# Patient Record
Sex: Female | Born: 1940 | Race: White | Hispanic: No | Marital: Married | Smoking: Never smoker
Health system: Southern US, Community
[De-identification: ages and names within clinical notes are randomized; demographics above are authoritative.]

## PROBLEM LIST (undated history)

## (undated) DIAGNOSIS — Z853 Personal history of malignant neoplasm of breast: Secondary | ICD-10-CM

## (undated) DIAGNOSIS — G20A1 Parkinson's disease without dyskinesia, without mention of fluctuations: Secondary | ICD-10-CM

## (undated) DIAGNOSIS — G20 Parkinson's disease: Secondary | ICD-10-CM

## (undated) HISTORY — PX: ABDOMINAL HYSTERECTOMY: SHX81

## (undated) HISTORY — PX: OTHER SURGICAL HISTORY: SHX169

## (undated) HISTORY — PX: BLADDER REPAIR: SHX76

## (undated) HISTORY — PX: EYE SURGERY: SHX253

## (undated) HISTORY — PX: BREAST SURGERY: SHX581

## (undated) HISTORY — PX: PITUITARY SURGERY: SHX203

---

## 2004-05-14 ENCOUNTER — Ambulatory Visit: Payer: Self-pay | Admitting: Family Medicine

## 2004-08-19 ENCOUNTER — Ambulatory Visit: Payer: Self-pay | Admitting: Family Medicine

## 2005-02-13 ENCOUNTER — Ambulatory Visit: Payer: Self-pay | Admitting: Family Medicine

## 2005-04-22 ENCOUNTER — Ambulatory Visit: Payer: Self-pay | Admitting: Family Medicine

## 2005-04-25 ENCOUNTER — Ambulatory Visit (HOSPITAL_COMMUNITY): Admission: RE | Admit: 2005-04-25 | Discharge: 2005-04-25 | Payer: Self-pay | Admitting: Urology

## 2007-05-17 ENCOUNTER — Ambulatory Visit (HOSPITAL_COMMUNITY): Admission: RE | Admit: 2007-05-17 | Discharge: 2007-05-17 | Payer: Self-pay | Admitting: Urology

## 2011-04-08 LAB — CREATININE, SERUM
Creatinine, Ser: 0.8
GFR calc Af Amer: >60
GFR calc non Af Amer: >60

## 2015-05-19 ENCOUNTER — Encounter (HOSPITAL_COMMUNITY)
Admission: EM | Disposition: A | Discharge status: Skilled Nursing Facility | Payer: Self-pay | Source: Home / Self Care | Attending: Orthopaedic Surgery

## 2015-05-19 ENCOUNTER — Inpatient Hospital Stay (HOSPITAL_COMMUNITY): Payer: Medicare PPO | Admitting: Anesthesiology

## 2015-05-19 ENCOUNTER — Inpatient Hospital Stay (HOSPITAL_COMMUNITY): Payer: Medicare PPO

## 2015-05-19 ENCOUNTER — Emergency Department (HOSPITAL_COMMUNITY): Payer: Medicare PPO

## 2015-05-19 ENCOUNTER — Inpatient Hospital Stay (HOSPITAL_COMMUNITY)
Admission: EM | Admit: 2015-05-19 | Discharge: 2015-05-23 | DRG: 481 | Disposition: A | Discharge status: Skilled Nursing Facility | Payer: Medicare PPO | Attending: Orthopaedic Surgery | Admitting: Orthopaedic Surgery

## 2015-05-19 ENCOUNTER — Encounter (HOSPITAL_COMMUNITY): Payer: Self-pay

## 2015-05-19 DIAGNOSIS — W19XXXA Unspecified fall, initial encounter: Secondary | ICD-10-CM

## 2015-05-19 DIAGNOSIS — G20 Parkinson's disease: Secondary | ICD-10-CM | POA: Diagnosis present

## 2015-05-19 DIAGNOSIS — R509 Fever, unspecified: Secondary | ICD-10-CM | POA: Insufficient documentation

## 2015-05-19 DIAGNOSIS — S52271A Monteggia's fracture of right ulna, initial encounter for closed fracture: Secondary | ICD-10-CM | POA: Diagnosis not present

## 2015-05-19 DIAGNOSIS — S52021A Displaced fracture of olecranon process without intraarticular extension of right ulna, initial encounter for closed fracture: Secondary | ICD-10-CM | POA: Diagnosis present

## 2015-05-19 DIAGNOSIS — Z9221 Personal history of antineoplastic chemotherapy: Secondary | ICD-10-CM | POA: Diagnosis not present

## 2015-05-19 DIAGNOSIS — Z9842 Cataract extraction status, left eye: Secondary | ICD-10-CM

## 2015-05-19 DIAGNOSIS — S72141S Displaced intertrochanteric fracture of right femur, sequela: Secondary | ICD-10-CM | POA: Diagnosis not present

## 2015-05-19 DIAGNOSIS — Z9011 Acquired absence of right breast and nipple: Secondary | ICD-10-CM

## 2015-05-19 DIAGNOSIS — Z1889 Other specified retained foreign body fragments: Secondary | ICD-10-CM

## 2015-05-19 DIAGNOSIS — S72141A Displaced intertrochanteric fracture of right femur, initial encounter for closed fracture: Secondary | ICD-10-CM | POA: Diagnosis present

## 2015-05-19 DIAGNOSIS — T148XXA Other injury of unspecified body region, initial encounter: Secondary | ICD-10-CM

## 2015-05-19 DIAGNOSIS — Z90710 Acquired absence of both cervix and uterus: Secondary | ICD-10-CM

## 2015-05-19 DIAGNOSIS — M810 Age-related osteoporosis without current pathological fracture: Secondary | ICD-10-CM | POA: Diagnosis present

## 2015-05-19 DIAGNOSIS — M1611 Unilateral primary osteoarthritis, right hip: Secondary | ICD-10-CM | POA: Diagnosis present

## 2015-05-19 DIAGNOSIS — S52121A Displaced fracture of head of right radius, initial encounter for closed fracture: Secondary | ICD-10-CM | POA: Diagnosis present

## 2015-05-19 DIAGNOSIS — J9811 Atelectasis: Secondary | ICD-10-CM | POA: Diagnosis not present

## 2015-05-19 DIAGNOSIS — S72009A Fracture of unspecified part of neck of unspecified femur, initial encounter for closed fracture: Secondary | ICD-10-CM | POA: Diagnosis present

## 2015-05-19 DIAGNOSIS — W1789XA Other fall from one level to another, initial encounter: Secondary | ICD-10-CM | POA: Diagnosis present

## 2015-05-19 DIAGNOSIS — Z419 Encounter for procedure for purposes other than remedying health state, unspecified: Secondary | ICD-10-CM

## 2015-05-19 DIAGNOSIS — Z853 Personal history of malignant neoplasm of breast: Secondary | ICD-10-CM

## 2015-05-19 DIAGNOSIS — Z9841 Cataract extraction status, right eye: Secondary | ICD-10-CM | POA: Diagnosis not present

## 2015-05-19 DIAGNOSIS — R011 Cardiac murmur, unspecified: Secondary | ICD-10-CM | POA: Diagnosis not present

## 2015-05-19 DIAGNOSIS — D62 Acute posthemorrhagic anemia: Secondary | ICD-10-CM | POA: Diagnosis not present

## 2015-05-19 DIAGNOSIS — T148 Other injury of unspecified body region: Secondary | ICD-10-CM | POA: Diagnosis not present

## 2015-05-19 HISTORY — PX: FEMUR IM NAIL: SHX1597

## 2015-05-19 HISTORY — PX: ORIF ELBOW FRACTURE: SHX5031

## 2015-05-19 HISTORY — PX: RADIAL HEAD ARTHROPLASTY: SHX6044

## 2015-05-19 HISTORY — DX: Personal history of malignant neoplasm of breast: Z85.3

## 2015-05-19 HISTORY — DX: Parkinson's disease without dyskinesia, without mention of fluctuations: G20.A1

## 2015-05-19 HISTORY — DX: Parkinson's disease: G20

## 2015-05-19 LAB — CBC WITH DIFFERENTIAL/PLATELET
Basophils Absolute: 0 10*3/uL (ref 0.0–0.1)
Basophils Relative: 0 %
EOS PCT: 1 %
Eosinophils Absolute: 0.1 10*3/uL (ref 0.0–0.7)
HCT: 36.7 % (ref 36.0–46.0)
HEMOGLOBIN: 12 g/dL (ref 12.0–15.0)
Lymphocytes Relative: 8 %
Lymphs Abs: 1 10*3/uL (ref 0.7–4.0)
MCH: 28.2 pg (ref 26.0–34.0)
MCHC: 32.7 g/dL (ref 30.0–36.0)
MCV: 86.4 fL (ref 78.0–100.0)
Monocytes Absolute: 0.9 10*3/uL (ref 0.1–1.0)
Monocytes Relative: 7 %
NEUTROS PCT: 84 %
Neutro Abs: 10.5 10*3/uL — ABNORMAL HIGH (ref 1.7–7.7)
Platelets: 262 10*3/uL (ref 150–400)
RBC: 4.25 MIL/uL (ref 3.87–5.11)
RDW: 13.8 % (ref 11.5–15.5)
WBC: 12.5 10*3/uL — ABNORMAL HIGH (ref 4.0–10.5)

## 2015-05-19 LAB — BASIC METABOLIC PANEL
Anion gap: 8 (ref 5–15)
BUN: 10 mg/dL (ref 6–20)
CO2: 22 mmol/L (ref 22–32)
Calcium: 9.3 mg/dL (ref 8.9–10.3)
Chloride: 109 mmol/L (ref 101–111)
Creatinine, Ser: 0.63 mg/dL (ref 0.44–1.00)
Glucose, Bld: 115 mg/dL — ABNORMAL HIGH (ref 65–99)
Potassium: 3.5 mmol/L (ref 3.5–5.1)
Sodium: 139 mmol/L (ref 135–145)

## 2015-05-19 SURGERY — OPEN REDUCTION INTERNAL FIXATION (ORIF) ELBOW/OLECRANON FRACTURE
Anesthesia: General | Site: Hip | Laterality: Right

## 2015-05-19 MED ORDER — CARBIDOPA-LEVODOPA ER 25-100 MG PO TBCR
2.0000 | EXTENDED_RELEASE_TABLET | ORAL | Status: DC
  Administered 2015-05-20 – 2015-05-23 (×12): 2 via ORAL
  Filled 2015-05-19 (×16): qty 2

## 2015-05-19 MED ORDER — CEFAZOLIN SODIUM-DEXTROSE 2-3 GM-% IV SOLR
INTRAVENOUS | Status: DC | PRN
  Administered 2015-05-19 (×2): 2 g via INTRAVENOUS

## 2015-05-19 MED ORDER — LIDOCAINE HCL (CARDIAC) 20 MG/ML IV SOLN
INTRAVENOUS | Status: AC
  Filled 2015-05-19: qty 5

## 2015-05-19 MED ORDER — SUCCINYLCHOLINE CHLORIDE 20 MG/ML IJ SOLN
INTRAMUSCULAR | Status: AC
  Filled 2015-05-19: qty 1

## 2015-05-19 MED ORDER — FENTANYL CITRATE (PF) 100 MCG/2ML IJ SOLN
100.0000 ug | INTRAMUSCULAR | Status: DC | PRN
  Administered 2015-05-19 (×2): 100 ug via INTRAVENOUS
  Filled 2015-05-19 (×2): qty 2

## 2015-05-19 MED ORDER — LACTATED RINGERS IV SOLN
INTRAVENOUS | Status: DC | PRN
  Administered 2015-05-19 (×2): via INTRAVENOUS

## 2015-05-19 MED ORDER — DEXTROSE-NACL 5-0.45 % IV SOLN: INTRAVENOUS | Status: DC

## 2015-05-19 MED ORDER — PHENYLEPHRINE HCL 10 MG/ML IJ SOLN
INTRAMUSCULAR | Status: DC | PRN
  Administered 2015-05-19 (×3): 40 ug via INTRAVENOUS

## 2015-05-19 MED ORDER — PROPOFOL 10 MG/ML IV BOLUS
INTRAVENOUS | Status: AC
  Filled 2015-05-19: qty 20

## 2015-05-19 MED ORDER — ROCURONIUM BROMIDE 100 MG/10ML IV SOLN
INTRAVENOUS | Status: DC | PRN
  Administered 2015-05-19: 50 mg via INTRAVENOUS

## 2015-05-19 MED ORDER — PROPOFOL 10 MG/ML IV BOLUS
INTRAVENOUS | Status: DC | PRN
  Administered 2015-05-19: 80 mg via INTRAVENOUS

## 2015-05-19 MED ORDER — CEFAZOLIN SODIUM-DEXTROSE 2-3 GM-% IV SOLR
INTRAVENOUS | Status: AC
  Filled 2015-05-19: qty 50

## 2015-05-19 MED ORDER — 0.9 % SODIUM CHLORIDE (POUR BTL) OPTIME
TOPICAL | Status: DC | PRN
  Administered 2015-05-19: 1000 mL

## 2015-05-19 MED ORDER — FENTANYL CITRATE (PF) 250 MCG/5ML IJ SOLN
INTRAMUSCULAR | Status: DC | PRN
  Administered 2015-05-19 (×5): 50 ug via INTRAVENOUS

## 2015-05-19 MED ORDER — HYDROMORPHONE HCL 1 MG/ML IJ SOLN: 0.5000 mg | INTRAMUSCULAR | Status: DC | PRN

## 2015-05-19 MED ORDER — FENTANYL CITRATE (PF) 250 MCG/5ML IJ SOLN
INTRAMUSCULAR | Status: AC
  Filled 2015-05-19: qty 5

## 2015-05-19 MED ORDER — PHENYLEPHRINE 40 MCG/ML (10ML) SYRINGE FOR IV PUSH (FOR BLOOD PRESSURE SUPPORT)
PREFILLED_SYRINGE | INTRAVENOUS | Status: AC
  Filled 2015-05-19: qty 30

## 2015-05-19 MED ORDER — BUPIVACAINE HCL (PF) 0.25 % IJ SOLN
INTRAMUSCULAR | Status: AC
  Filled 2015-05-19: qty 30

## 2015-05-19 MED ORDER — LIDOCAINE HCL (CARDIAC) 20 MG/ML IV SOLN
INTRAVENOUS | Status: DC | PRN
  Administered 2015-05-19: 50 mg via INTRAVENOUS

## 2015-05-19 MED ORDER — SUGAMMADEX SODIUM 200 MG/2ML IV SOLN
INTRAVENOUS | Status: AC
  Filled 2015-05-19: qty 2

## 2015-05-19 SURGICAL SUPPLY — 107 items
BANDAGE ELASTIC 3 VELCRO ST LF (GAUZE/BANDAGES/DRESSINGS) ×3 IMPLANT
BANDAGE ELASTIC 4 VELCRO ST LF (GAUZE/BANDAGES/DRESSINGS) ×3 IMPLANT
BANDAGE ELASTIC 6 VELCRO ST LF (GAUZE/BANDAGES/DRESSINGS) ×3 IMPLANT
BENZOIN TINCTURE PRP APPL 2/3 (GAUZE/BANDAGES/DRESSINGS) IMPLANT
BIT DRILL 2.5X2.75 QC CALB (BIT) ×3 IMPLANT
BIT DRILL 4.3MMS DISTAL GRDTED (BIT) ×2 IMPLANT
BLADE AVERAGE 25X9 (BLADE) ×3 IMPLANT
BLADE SURG 10 STRL SS (BLADE) IMPLANT
BLADE SURG 15 STRL LF DISP TIS (BLADE) ×2 IMPLANT
BLADE SURG 15 STRL SS (BLADE) ×1
BLADE SURG ROTATE 9660 (MISCELLANEOUS) ×3 IMPLANT
BNDG COHESIVE 3X5 TAN STRL LF (GAUZE/BANDAGES/DRESSINGS) ×3 IMPLANT
BNDG COHESIVE 4X5 TAN STRL (GAUZE/BANDAGES/DRESSINGS) ×3 IMPLANT
BNDG ESMARK 4X9 LF (GAUZE/BANDAGES/DRESSINGS) ×3 IMPLANT
BNDG GAUZE ELAST 4 BULKY (GAUZE/BANDAGES/DRESSINGS) ×3 IMPLANT
CLEANER TIP ELECTROSURG 2X2 (MISCELLANEOUS) ×3 IMPLANT
COVER MAYO STAND STRL (DRAPES) ×3 IMPLANT
COVER PERINEAL POST (MISCELLANEOUS) ×3 IMPLANT
COVER SURGICAL LIGHT HANDLE (MISCELLANEOUS) ×3 IMPLANT
COVER TABLE BACK 60X90 (DRAPES) ×3 IMPLANT
CUFF TOURNIQUET SINGLE 18IN (TOURNIQUET CUFF) IMPLANT
CUFF TOURNIQUET SINGLE 24IN (TOURNIQUET CUFF) IMPLANT
DECANTER SPIKE VIAL GLASS SM (MISCELLANEOUS) IMPLANT
DRAPE C-ARM 42X72 X-RAY (DRAPES) ×3 IMPLANT
DRAPE INCISE IOBAN 66X45 STRL (DRAPES) IMPLANT
DRAPE STERI IOBAN 125X83 (DRAPES) ×3 IMPLANT
DRAPE U-SHAPE 47X51 STRL (DRAPES) ×3 IMPLANT
DRILL 4.3MMS DISTAL GRADUATED (BIT) ×3
DRSG ADAPTIC 3X8 NADH LF (GAUZE/BANDAGES/DRESSINGS) ×3 IMPLANT
DRSG MEPILEX BORDER 4X4 (GAUZE/BANDAGES/DRESSINGS) ×3 IMPLANT
DRSG MEPILEX BORDER 4X8 (GAUZE/BANDAGES/DRESSINGS) ×3 IMPLANT
DRSG PAD ABDOMINAL 8X10 ST (GAUZE/BANDAGES/DRESSINGS) ×3 IMPLANT
DURAPREP 26ML APPLICATOR (WOUND CARE) ×3 IMPLANT
ELECT REM PT RETURN 9FT ADLT (ELECTROSURGICAL) ×3
ELECTRODE REM PT RTRN 9FT ADLT (ELECTROSURGICAL) ×2 IMPLANT
EVACUATOR 1/8 PVC DRAIN (DRAIN) IMPLANT
FACESHIELD WRAPAROUND (MASK) IMPLANT
GAUZE SPONGE 4X4 12PLY STRL (GAUZE/BANDAGES/DRESSINGS) ×3 IMPLANT
GAUZE XEROFORM 1X8 LF (GAUZE/BANDAGES/DRESSINGS) ×3 IMPLANT
GAUZE XEROFORM 5X9 LF (GAUZE/BANDAGES/DRESSINGS) ×3 IMPLANT
GLOVE BIOGEL PI IND STRL 8 (GLOVE) ×4 IMPLANT
GLOVE BIOGEL PI INDICATOR 8 (GLOVE) ×2
GLOVE ORTHO TXT STRL SZ7.5 (GLOVE) ×66 IMPLANT
GOWN STRL REUS W/ TWL LRG LVL3 (GOWN DISPOSABLE) ×2 IMPLANT
GOWN STRL REUS W/ TWL XL LVL3 (GOWN DISPOSABLE) ×2 IMPLANT
GOWN STRL REUS W/TWL 2XL LVL3 (GOWN DISPOSABLE) ×3 IMPLANT
GOWN STRL REUS W/TWL LRG LVL3 (GOWN DISPOSABLE) ×1
GOWN STRL REUS W/TWL XL LVL3 (GOWN DISPOSABLE) ×1
GUIDEPIN 3.2X17.5 THRD DISP (PIN) ×3 IMPLANT
GUIDEWIRE BALL NOSE 100CM (WIRE) ×3 IMPLANT
HD RADIAL EXPLOR 14X22 (Head) ×3 IMPLANT
HEAD RADIAL EXPLOR 14X22 (Head) ×2 IMPLANT
IMPL STEM W/SCREW 7X26MM (Stem) ×2 IMPLANT
IMPLANT STEM W/SCREW 7X26MM (Stem) ×3 IMPLANT
K-WIRE FIXATION 2.0X6 (WIRE) ×6
KIT BASIN OR (CUSTOM PROCEDURE TRAY) ×3 IMPLANT
KIT ROOM TURNOVER OR (KITS) ×3 IMPLANT
KWIRE FIXATION 2.0X6 (WIRE) ×4 IMPLANT
LINER BOOT UNIVERSAL DISP (MISCELLANEOUS) ×3 IMPLANT
MANIFOLD NEPTUNE II (INSTRUMENTS) ×3 IMPLANT
NAIL AFFIXUS RT 11X340MM (Nail) ×3 IMPLANT
NS IRRIG 1000ML POUR BTL (IV SOLUTION) ×3 IMPLANT
PACK GENERAL/GYN (CUSTOM PROCEDURE TRAY) ×3 IMPLANT
PACK ORTHO EXTREMITY (CUSTOM PROCEDURE TRAY) ×3 IMPLANT
PAD ARMBOARD 7.5X6 YLW CONV (MISCELLANEOUS) ×6 IMPLANT
PAD CAST 4YDX4 CTTN HI CHSV (CAST SUPPLIES) ×2 IMPLANT
PADDING CAST COTTON 4X4 STRL (CAST SUPPLIES) ×1
PLATE OLECRANON SM (Plate) ×3 IMPLANT
SCREW ANTI ROTATION 80MM (Screw) ×3 IMPLANT
SCREW BONE CORTICAL 5.0X40 (Screw) ×3 IMPLANT
SCREW CORT 3.5X26 (Screw) ×1 IMPLANT
SCREW CORT T15 26X3.5XST LCK (Screw) ×2 IMPLANT
SCREW CORT T15 30X3.5XST LCK (Screw) ×2 IMPLANT
SCREW CORTICAL 3.5X30MM (Screw) ×1 IMPLANT
SCREW CORTICAL LOW PROF 3.5X20 (Screw) ×3 IMPLANT
SCREW DRILL BIT ANIT ROTATION (BIT) ×6 IMPLANT
SCREW LAG HIP NAIL 10.5X95 (Screw) ×3 IMPLANT
SCREW LOCK CORT STAR 3.5X12 (Screw) ×3 IMPLANT
SCREW LOCK CORT STAR 3.5X14 (Screw) ×3 IMPLANT
SCREW LOCK CORT STAR 3.5X16 (Screw) ×3 IMPLANT
SCREW LOCK CORT STAR 3.5X18 (Screw) ×3 IMPLANT
SCREW LOCK CORT STAR 3.5X20 (Screw) ×6 IMPLANT
SCREW LOCK CORT STAR 3.5X22 (Screw) ×6 IMPLANT
SCREW NL LP 36X3.5 (Screw) ×3 IMPLANT
SCREW NON LOCKING LP 3.5 16MM (Screw) ×3 IMPLANT
SPLINT PLASTER CAST XFAST 4X15 (CAST SUPPLIES) ×2 IMPLANT
SPLINT PLASTER XTRA FAST SET 4 (CAST SUPPLIES) ×1
SPONGE GAUZE 4X4 12PLY STER LF (GAUZE/BANDAGES/DRESSINGS) ×3 IMPLANT
SPONGE LAP 18X18 X RAY DECT (DISPOSABLE) ×6 IMPLANT
SPONGE SCRUB IODOPHOR (GAUZE/BANDAGES/DRESSINGS) ×3 IMPLANT
STAPLER VISISTAT 35W (STAPLE) ×3 IMPLANT
STOCKINETTE IMPERVIOUS 9X36 MD (GAUZE/BANDAGES/DRESSINGS) ×3 IMPLANT
STRIP CLOSURE SKIN 1/2X4 (GAUZE/BANDAGES/DRESSINGS) IMPLANT
SUCTION FRAZIER TIP 10 FR DISP (SUCTIONS) ×3 IMPLANT
SUT PROLENE 3 0 PS 2 (SUTURE) ×6 IMPLANT
SUT VIC AB 0 CT1 27 (SUTURE) ×2
SUT VIC AB 0 CT1 27XBRD ANBCTR (SUTURE) ×4 IMPLANT
SUT VIC AB 2-0 CT1 27 (SUTURE) ×2
SUT VIC AB 2-0 CT1 TAPERPNT 27 (SUTURE) ×4 IMPLANT
SYR CONTROL 10ML LL (SYRINGE) ×3 IMPLANT
TOWEL OR 17X24 6PK STRL BLUE (TOWEL DISPOSABLE) IMPLANT
TOWEL OR 17X26 10 PK STRL BLUE (TOWEL DISPOSABLE) ×3 IMPLANT
TUBE CONNECTING 12X1/4 (SUCTIONS) ×3 IMPLANT
UNDERPAD 30X30 INCONTINENT (UNDERPADS AND DIAPERS) ×3 IMPLANT
WASHER 3.5MM (Orthopedic Implant) ×3 IMPLANT
WATER STERILE IRR 1000ML POUR (IV SOLUTION) ×6 IMPLANT
YANKAUER SUCT BULB TIP NO VENT (SUCTIONS) ×3 IMPLANT

## 2015-05-19 NOTE — H&P (Signed)
Tina Shah is an 74 y.o. female.   Chief Complaint: fall getting out of her car going to a restaurant with right elbow pain and right hip pain HPI:  parkinsonism with right elbow deformity and right intertrochanteric hip fracture was shortened and externally rotated the leg. Unable to walk after fall. Brought to ER by EMS.  Past Medical History  Diagnosis Date  . Parkinson disease [facility])     Past Surgical History  Procedure Laterality Date  . Breast surgery      right breast mastectomy  . Pituitary surgery    . Shoulder Left   . Abdominal hysterectomy    . Bladder repair    . Eye surgery Bilateral     cataract   Diagnosis breast cancer 1989 right breast chemotherapy 6 months. Rectal fissure 1990 hysterectomy 1994 bladder repair 2003 shoulder surgery 09/03/2004 for respiratory. Cataract surgery both eyes 2010 pituitary tumor 2011. Family History  Problem Relation Age of Onset  . Family history unknown: Yes   Social History:  reports that she has never smoked. She does not have any smokeless tobacco history on file. She reports that she does not drink alcohol or use illicit drugs.  Allergies: No Known Allergies   (Not in a hospital admission)  Results for orders placed or performed during the hospital encounter of 05/19/15 (from the past 48 hour(s))  Basic metabolic panel     Status: Abnormal   Collection Time: 05/19/15  4:45 PM  Result Value Ref Range   Sodium 139 135 - 145 mmol/L   Potassium 3.5 3.5 - 5.1 mmol/L   Chloride 109 101 - 111 mmol/L   CO2 22 22 - 32 mmol/L   Glucose, Bld 115 (H) 65 - 99 mg/dL   BUN 10 6 - 20 mg/dL   Creatinine, Ser 0.63 0.44 - 1.00 mg/dL   Calcium 9.3 8.9 - 10.3 mg/dL   GFR calc non Af Amer >60 >60 mL/min   GFR calc Af Amer >60 >60 mL/min    Comment: (NOTE) The eGFR has been calculated using the CKD EPI equation. This calculation has not been validated in all clinical situations. eGFR's persistently <60 mL/min signify possible Chronic  Kidney Disease.    Anion gap 8 5 - 15  CBC with Differential/Platelet     Status: Abnormal   Collection Time: 05/19/15  4:45 PM  Result Value Ref Range   WBC 12.5 (H) 4.0 - 10.5 K/uL   RBC 4.25 3.87 - 5.11 MIL/uL   Hemoglobin 12.0 12.0 - 15.0 g/dL   HCT 36.7 36.0 - 46.0 %   MCV 86.4 78.0 - 100.0 fL   MCH 28.2 26.0 - 34.0 pg   MCHC 32.7 30.0 - 36.0 g/dL   RDW 13.8 11.5 - 15.5 %   Platelets 262 150 - 400 K/uL   Neutrophils Relative % 84 %   Neutro Abs 10.5 (H) 1.7 - 7.7 K/uL   Lymphocytes Relative 8 %   Lymphs Abs 1.0 0.7 - 4.0 K/uL   Monocytes Relative 7 %   Monocytes Absolute 0.9 0.1 - 1.0 K/uL   Eosinophils Relative 1 %   Eosinophils Absolute 0.1 0.0 - 0.7 K/uL   Basophils Relative 0 %   Basophils Absolute 0.0 0.0 - 0.1 K/uL   Dg Chest 1 View  05/19/2015  CLINICAL DATA:  Pre operative respiratory exam.  Right hip fracture. EXAM: CHEST 1 VIEW COMPARISON:  None. FINDINGS: Heart size and pulmonary vascularity are normal. Previous right mastectomy.  3 mm density overlying the anterior aspect of the right third rib is probably a granuloma. Lungs are otherwise clear. No acute osseous abnormality. IMPRESSION: No significant abnormality. Electronically Signed   By: Lorriane Shire M.D.   On: 05/19/2015 16:01   Dg Elbow 2 Views Right  05/19/2015  CLINICAL DATA:  Fall.  Pain.  Initial encounter. EXAM: RIGHT ELBOW - 2 VIEW COMPARISON:  None. FINDINGS: Anatomic distortion, secondary to patient immobility and pain. Comminuted fracture of the proximal ulna. Suspect posterior dislocation of the radial head relative to the capitellum. Diffuse soft tissue swelling. IMPRESSION: Comminuted proximal ulna fracture. Anatomic distortion secondary to positioning and pain. Suspicion of posterior dislocation of the radial head. CT versus more complete radiographs should be considered for further characterization. Electronically Signed   By: Abigail Miyamoto M.D.   On: 05/19/2015 16:02   Dg Hip Unilat With Pelvis  2-3 Views Right  05/19/2015  CLINICAL DATA:  Right hip pain after the patient fell exiting her car today. EXAM: DG HIP (WITH OR WITHOUT PELVIS) 2-3V RIGHT COMPARISON:  None. FINDINGS: There is a comminuted angulated over riding intertrochanteric fracture of the proximal right femur. The lesser trochanter is avulsed. The superior aspect of the greater trochanter is comminuted. Marginal osteophytes on the right femoral head and right acetabulum. Bilateral calcifications in the origins of the hamstring tendons. IMPRESSION: Comminuted angulated overriding intertrochanteric fracture of the proximal right femur. Osteoarthritis of the right hip joint. Electronically Signed   By: Lorriane Shire M.D.   On: 05/19/2015 15:59    Review of Systems  Constitutional: Negative for fever and chills.  Cardiovascular: Negative for chest pain and orthopnea.  Musculoskeletal: Positive for falls.  Neurological: Positive for tremors.       Parkinson's disease on medications  Psychiatric/Behavioral:       Pituitary surgery2011    Blood pressure 139/73, pulse 94, temperature 98.2 F (36.8 C), temperature source Oral, resp. rate 20, SpO2 100 %. Physical Exam  Constitutional: She appears well-developed and well-nourished.  HENT:  Head: Normocephalic and atraumatic.  Eyes: Conjunctivae are normal. Pupils are equal, round, and reactive to light.  Neck: Normal range of motion.  Cardiovascular: Normal rate.   Respiratory: Effort normal and breath sounds normal.  GI: Soft.  Musculoskeletal:  Ulnar median sensation intact right hand. Abrasion over the radial head not through the dermis. Right lower extremity shortening next are rotated. Intact pulses and intact sensation.  Neurological: She is alert.  Tremor upper lower extremities  Psychiatric: She has a normal mood and affect. Judgment and thought content normal.     Assessment/Plan Right Monteggia injury with fracture of the proximal ulna and radial head  dislocation. CT scan pending. Right the Interthroch  fracture with shortening and varus position. Plan ORIF right elbow and right hip tonight with her Parkinsonism will do better with early surgery and mobilization.   Giovonni Poirier C 05/19/2015, 6:51 PM

## 2015-05-19 NOTE — H&P (Signed)
Family Medicine Teaching [facility] Admission History and Physical Service Pager: [phone]  Patient name: Tina Shah Medical record number: [id number] Date of birth: [birth year] Age: 74 y.o. Gender: female  Primary Care Provider: No primary care provider on file. Consultants: Orthopedic Surgery Code Status: Full  Chief Complaint: Right elbow pain/swelling and right hip pain after fall  Assessment and Plan: Tina Shah is a 74 y.o. female presenting with right elbow pain/swelling and right hip pain after fall. PMH is significant for osteoporosis, Parkinson's disease, breast cancer s/p right breast mastectomy.  1. Right elbow fracture and right hip fracture: X-ray right elbow x-ray right elbow showed comminuted proximal ulnar fracture with suspicion of posterior dislocation of the radial head. X-ray of the right hip showed comminuted angulated overriding intertrochanteric fracture of the right proximal femur. On exam, right elbow is markedly swollen. Right leg is externally rotated. She is neurovascularly intact in her right arm and leg. - Admit to FPTS, attending Dr. Deirdre Priest. - Will receive surgery with Ortho this evening. Follow-up any post-op recs. - Fentanyl q1hr prn for pain control - Zofran for nausea - NPO for now, can start diet after surgery. - PT/OT after surgery.  2. Osteoporosis: Baseline osteoporosis likely contributed to Pt's fractures. - Holding home meds: Alendronate  weekly, calcium + vitamin D.  3. Parkinson's disease: On exam, Pt has pill-rolling tremor bilaterally. She lives alone and is able to perform her IDLs and IADLs without difficulty. - Continue home med: Pramipexole 0.125mg  qhs and Sinemet 25-100mg  2 tablets tid  FEN/GI: D5 1/2 NS at 130ml/hr, NPO for now, can advance diet after surgery. Miralax for bowel regimen. Prophylaxis: SCDs for now. Will start anticoagulation after surgery.  Disposition: Admit to FPTS, attending Dr. Deirdre Priest.  Discharge pending improvement after surgery and PT/OT recs. May be a candidate for CIR or SNF.  History of Present Illness:  Tina Shah is a 74 y.o. female presenting with right elbow pain and swelling and right hip pain after fall. Patient was coming out of her car trying to go to a restaurant and she fell, hitting her right side on the pavement. She states the ground was uneven. No chest pain, dyspnea, dizziness or lightheadedness. She has a remote history of ankle fracture, otherwise, no other surgeries. She also has a history of left shoulder surgery for a bone spur. She has a history of osteoporosis on Fosamax, calcium and vitamin D. She was previously able to perform all her ADLs and IADLs. She lives at home alone. She can ambulate independently without a walker.  In the ED, x-ray right elbow showed comminuted proximal ulnar fracture with suspicion of posterior dislocation of the radial head. X-ray of the right hip showed comminuted angulated overriding intertrochanteric fracture of the right proximal femur. She was treated for pain with Fentanyl 0.5mg  q1hr prn. Orthopedic surgery was consulted and decided to take her for surgery this evening.  Review Of Systems: Per HPI with the following additions: None Otherwise the remainder of the systems were negative.  Patient Active Problem List   Diagnosis Date Noted  . Hip fracture (HCC) 05/19/2015    Past Medical History: Past Medical History  Diagnosis Date  . Parkinson disease [facility])     Past Surgical History: Past Surgical History  Procedure Laterality Date  . Breast surgery      right breast mastectomy  . Pituitary surgery    . Shoulder Left   . Abdominal hysterectomy    . Bladder repair    .  Eye surgery Bilateral     cataract    Social History: Social History  Substance Use Topics  . Smoking status: Never Smoker   . Smokeless tobacco: None  . Alcohol Use: No   Additional social history: Lives at home alone, able to  ambulate and take care of herself without any issues. Please also refer to relevant sections of EMR.  Family History: Family History  Problem Relation Age of Onset  . Family history unknown: Yes  Dad: Heart disease Mom: Heart disease, breast cancer  Allergies and Medications: No Known Allergies No current facility-administered medications on file prior to encounter.   No current outpatient prescriptions on file prior to encounter.    Objective: BP 139/73 mmHg  Pulse 94  Temp(Src) 98.2 F (36.8 C) (Oral)  Resp 20  SpO2 100% Exam: General: Elderly female, laying in bed, in NAD, holding right arm and leg still. HEENT: [place]/AT, EOMI, PERRLA, moist mucous membranes Neck: Supple, normal ROM Cardiovascular: 2/6 holosystolic murmur heard best over the LUSB, RRR; 2+ DP pulses, 2+ radial pulses. Respiratory: Anterior lung fields are clear to auscultation bilaterally, normal work of breathing Abdomen: +BS, soft, non-tender, non-distended MSK: Significant swelling present over R elbow with a 1 inch long abrasion inferior to the olecranon; right leg is externally rotated. Skin: Warm and dry, no rashes Neuro: Awake, alert, oriented; pill-rolling tremor in upper extremities bilaterally; normal sensation in upper and lower extremities. Psych: Appropriate mood and affect.  Labs and Imaging: CBC BMET   Recent Labs Lab 05/19/15 1645  WBC 12.5*  HGB 12.0  HCT 36.7  PLT 262    Recent Labs Lab 05/19/15 1645  NA 139  K 3.5  CL 109  CO2 22  BUN 10  CREATININE 0.63  GLUCOSE 115*  CALCIUM 9.3     CXR (11/19): No acute abnormalities X-ray R elbow (11/19): comminuted proximal ulna fracture, suspicion of posterior dislocation of the radial head. CT R elbow (11/19): comminuted fracture of the olecranon process without articular surface involvement, with posterior displacement of the major distal fracture fragment. Posterior dislocation of the radial head relative to the capitellum.  Fracture of the volar aspect of the radial head.  X-ray R hip (11/19): comminuted angulated overriding intertrochanteric fracture of the proximal right femur.  Campbell StallKaty Dodd Mayo, MD 05/19/2015, 8:22 PM PGY-1, [facility] FPTS Intern pager: [phone], text pages welcome  I have seen and examined the patient. I have read and agree with the above note. My changes are noted in blue.  Jacquelin Hawkingalph Carel Schnee, MD PGY-3, [facility] 05/20/2015, 12:02 AM

## 2015-05-19 NOTE — ED Notes (Signed)
Attempted report to charge nurse.

## 2015-05-19 NOTE — Anesthesia Preprocedure Evaluation (Addendum)
Anesthesia Evaluation  Patient identified by MRN, date of birth, ID band Patient awake    Reviewed: Allergy & Precautions, H&P , NPO status , Patient's Chart, lab work & pertinent test results  Airway Mallampati: III  TM Distance: >3 FB Neck ROM: Full    Dental no notable dental hx. (+) Teeth Intact, Dental Advisory Given   Pulmonary neg pulmonary ROS,    Pulmonary exam normal breath sounds clear to auscultation       Cardiovascular negative cardio ROS   Rhythm:Regular Rate:Normal     Neuro/Psych Parkinson's dz negative neurological ROS  negative psych ROS   GI/Hepatic negative GI ROS, Neg liver ROS,   Endo/Other  negative endocrine ROS  Renal/GU negative Renal ROS  negative genitourinary   Musculoskeletal   Abdominal   Peds  Hematology negative hematology ROS (+)   Anesthesia Other Findings   Reproductive/Obstetrics negative OB ROS                            Anesthesia Physical Anesthesia Plan  ASA: II  Anesthesia Plan: General   Post-op Pain Management:    Induction: Intravenous  Airway Management Planned: Oral ETT  Additional Equipment:   Intra-op Plan:   Post-operative Plan: Extubation in OR  Informed Consent: I have reviewed the patients History and Physical, chart, labs and discussed the procedure including the risks, benefits and alternatives for the proposed anesthesia with the patient or authorized representative who has indicated his/her understanding and acceptance.   Dental advisory given  Plan Discussed with: CRNA, Anesthesiologist and Surgeon  Anesthesia Plan Comments:        Anesthesia Quick Evaluation

## 2015-05-19 NOTE — ED Notes (Signed)
Attempted report 

## 2015-05-19 NOTE — ED Notes (Signed)
To room via EMS.  Pt getting out of van,thinks she stepped on uneven ground and fell on right side.  Obvious deformity right elbow and pain to right hip.  Fentanyl 100 mcg given by EMS, 10 min PTA.

## 2015-05-19 NOTE — Interval H&P Note (Signed)
History and Physical Interval Note:  05/19/2015 6:59 PM  Tina Shah  has presented today for surgery, with the diagnosis of HIP FX  The various methods of treatment have been discussed with the patient and family. After consideration of risks, benefits and other options for treatment, the patient has consented to  Procedure(s): OPEN REDUCTION INTERNAL FIXATION (ORIF) ELBOW/OLECRANON FRACTURE (Right) ORIF INTERTROCH HIP FX (Right) as a surgical intervention .  The patient's history has been reviewed, patient examined, no change in status, stable for surgery.  I have reviewed the patient's chart and labs.  Questions were answered to the patient's satisfaction.   Right radial head dislocated Monteggia lesion. Plan ORIF elbow first then hip fixation.  YATES,MARK C

## 2015-05-19 NOTE — Anesthesia Procedure Notes (Addendum)
Procedure Name: Intubation Date/Time: 05/19/2015 8:34 PM Performed by: Brien MatesMAHONY, Joleen Stuckert D Pre-anesthesia Checklist: Patient identified, Emergency Drugs available, Suction available, Patient being monitored and Timeout performed Patient Re-evaluated:Patient Re-evaluated prior to inductionOxygen Delivery Method: Circle system utilized Preoxygenation: Pre-oxygenation with 100% oxygen Intubation Type: IV induction Ventilation: Mask ventilation without difficulty Laryngoscope Size: Miller, 2 and Glidescope (DL x 1 with Miller 2. Grade 3/4 view secondary to anterior anatomy and stiff neck. Second DLwith Glidescope successful with grade 2 view.) Grade View: Grade II Tube type: Oral Tube size: 7.5 mm Number of attempts: 2 Airway Equipment and Method: Stylet and Video-laryngoscopy Placement Confirmation: ETT inserted through vocal cords under direct vision,  positive ETCO2 and breath sounds checked- equal and bilateral Secured at: 21 cm Tube secured with: Tape Dental Injury: Teeth and Oropharynx as per pre-operative assessment

## 2015-05-19 NOTE — ED Notes (Signed)
Patient transported to CT 

## 2015-05-19 NOTE — ED Notes (Signed)
OR called for patient, take to room 37.  Will give report in room.

## 2015-05-19 NOTE — ED Notes (Signed)
Attempted report, pt will be going to 5N06, room is to be cleaned.

## 2015-05-20 ENCOUNTER — Inpatient Hospital Stay (HOSPITAL_COMMUNITY): Payer: Medicare PPO

## 2015-05-20 DIAGNOSIS — T148 Other injury of unspecified body region: Secondary | ICD-10-CM

## 2015-05-20 DIAGNOSIS — Z419 Encounter for procedure for purposes other than remedying health state, unspecified: Secondary | ICD-10-CM

## 2015-05-20 DIAGNOSIS — W19XXXA Unspecified fall, initial encounter: Secondary | ICD-10-CM | POA: Insufficient documentation

## 2015-05-20 DIAGNOSIS — S72141A Displaced intertrochanteric fracture of right femur, initial encounter for closed fracture: Secondary | ICD-10-CM | POA: Diagnosis present

## 2015-05-20 LAB — CBC
HCT: 32.8 % — ABNORMAL LOW (ref 36.0–46.0)
Hemoglobin: 10.6 g/dL — ABNORMAL LOW (ref 12.0–15.0)
MCH: 27.9 pg (ref 26.0–34.0)
MCHC: 32.3 g/dL (ref 30.0–36.0)
MCV: 86.3 fL (ref 78.0–100.0)
PLATELETS: 252 10*3/uL (ref 150–400)
RBC: 3.8 MIL/uL — AB (ref 3.87–5.11)
RDW: 14 % (ref 11.5–15.5)
WBC: 11 10*3/uL — AB (ref 4.0–10.5)

## 2015-05-20 LAB — BASIC METABOLIC PANEL
Anion gap: 7 (ref 5–15)
BUN: 13 mg/dL (ref 6–20)
CHLORIDE: 106 mmol/L (ref 101–111)
CO2: 26 mmol/L (ref 22–32)
CREATININE: 0.65 mg/dL (ref 0.44–1.00)
Calcium: 8.4 mg/dL — ABNORMAL LOW (ref 8.9–10.3)
Glucose, Bld: 129 mg/dL — ABNORMAL HIGH (ref 65–99)
POTASSIUM: 3.6 mmol/L (ref 3.5–5.1)
SODIUM: 139 mmol/L (ref 135–145)

## 2015-05-20 LAB — URINALYSIS, ROUTINE W REFLEX MICROSCOPIC
BILIRUBIN URINE: NEGATIVE
GLUCOSE, UA: NEGATIVE mg/dL
Hgb urine dipstick: NEGATIVE
KETONES UR: NEGATIVE mg/dL
Nitrite: NEGATIVE
PH: 5.5 (ref 5.0–8.0)
Protein, ur: NEGATIVE mg/dL
Specific Gravity, Urine: 1.017 (ref 1.005–1.030)

## 2015-05-20 LAB — URINE MICROSCOPIC-ADD ON

## 2015-05-20 MED ORDER — PRAVASTATIN SODIUM 40 MG PO TABS
40.0000 mg | ORAL_TABLET | Freq: Every day | ORAL | Status: DC
  Administered 2015-05-20 – 2015-05-22 (×3): 40 mg via ORAL
  Filled 2015-05-20 (×3): qty 1

## 2015-05-20 MED ORDER — SUGAMMADEX SODIUM 200 MG/2ML IV SOLN
INTRAVENOUS | Status: DC | PRN
  Administered 2015-05-20: 180 mg via INTRAVENOUS

## 2015-05-20 MED ORDER — POLYETHYLENE GLYCOL 3350 17 G PO PACK: 17.0000 g | PACK | Freq: Every day | ORAL | Status: DC | PRN

## 2015-05-20 MED ORDER — ONDANSETRON HCL 4 MG PO TABS: 4.0000 mg | ORAL_TABLET | Freq: Four times a day (QID) | ORAL | Status: DC | PRN

## 2015-05-20 MED ORDER — HYDROCODONE-ACETAMINOPHEN 5-325 MG PO TABS
1.0000 | ORAL_TABLET | Freq: Four times a day (QID) | ORAL | Status: DC | PRN
  Administered 2015-05-20 (×2): 2 via ORAL
  Filled 2015-05-20 (×3): qty 2

## 2015-05-20 MED ORDER — ARTIFICIAL TEARS OP OINT
TOPICAL_OINTMENT | OPHTHALMIC | Status: AC
  Filled 2015-05-20: qty 3.5

## 2015-05-20 MED ORDER — ACETAMINOPHEN 650 MG RE SUPP: 650.0000 mg | Freq: Four times a day (QID) | RECTAL | Status: DC | PRN

## 2015-05-20 MED ORDER — FENTANYL CITRATE (PF) 100 MCG/2ML IJ SOLN
INTRAMUSCULAR | Status: AC
  Filled 2015-05-20: qty 2

## 2015-05-20 MED ORDER — PRAMIPEXOLE DIHYDROCHLORIDE 0.125 MG PO TABS
0.1250 mg | ORAL_TABLET | Freq: Every day | ORAL | Status: DC
  Administered 2015-05-20 – 2015-05-22 (×3): 0.125 mg via ORAL
  Filled 2015-05-20 (×3): qty 1

## 2015-05-20 MED ORDER — VITAMIN B-12 1000 MCG PO TABS
1000.0000 ug | ORAL_TABLET | Freq: Every day | ORAL | Status: DC
  Administered 2015-05-20 – 2015-05-23 (×4): 1000 ug via ORAL
  Filled 2015-05-20 (×4): qty 1

## 2015-05-20 MED ORDER — METOCLOPRAMIDE HCL 5 MG/ML IJ SOLN: 5.0000 mg | Freq: Three times a day (TID) | INTRAMUSCULAR | Status: DC | PRN

## 2015-05-20 MED ORDER — ASPIRIN EC 81 MG PO TBEC: 81.0000 mg | DELAYED_RELEASE_TABLET | Freq: Every day | ORAL | Status: DC

## 2015-05-20 MED ORDER — CARBOXYMETHYLCELLULOSE SODIUM 1 % OP SOLN: 1.0000 [drp] | Freq: Three times a day (TID) | OPHTHALMIC | Status: DC

## 2015-05-20 MED ORDER — HYDROMORPHONE HCL 1 MG/ML IJ SOLN: 0.5000 mg | INTRAMUSCULAR | Status: DC | PRN

## 2015-05-20 MED ORDER — POLYVINYL ALCOHOL 1.4 % OP SOLN
1.0000 [drp] | Freq: Three times a day (TID) | OPHTHALMIC | Status: DC
  Administered 2015-05-20 – 2015-05-23 (×10): 1 [drp] via OPHTHALMIC
  Filled 2015-05-20: qty 15

## 2015-05-20 MED ORDER — POLYETHYLENE GLYCOL 3350 17 G PO PACK
17.0000 g | PACK | Freq: Every day | ORAL | Status: DC
  Administered 2015-05-20 – 2015-05-23 (×4): 17 g via ORAL
  Filled 2015-05-20 (×4): qty 1

## 2015-05-20 MED ORDER — SODIUM CHLORIDE 0.45 % IV SOLN
INTRAVENOUS | Status: DC
Start: 2015-05-20 — End: 2015-05-20
  Administered 2015-05-20: 02:00:00 via INTRAVENOUS

## 2015-05-20 MED ORDER — ACETAMINOPHEN 325 MG PO TABS
650.0000 mg | ORAL_TABLET | Freq: Four times a day (QID) | ORAL | Status: DC | PRN
  Administered 2015-05-21: 650 mg via ORAL
  Filled 2015-05-20: qty 2

## 2015-05-20 MED ORDER — VITAMIN B-12 1000 MCG SL SUBL: 1000.0000 ug | SUBLINGUAL_TABLET | Freq: Every day | SUBLINGUAL | Status: DC

## 2015-05-20 MED ORDER — BISACODYL 10 MG RE SUPP: 10.0000 mg | Freq: Every day | RECTAL | Status: DC | PRN

## 2015-05-20 MED ORDER — CARBIDOPA-LEVODOPA ER 25-100 MG PO TBCR
1.0000 | EXTENDED_RELEASE_TABLET | ORAL | Status: DC
Start: 2015-05-20 — End: 2015-05-20
  Filled 2015-05-20 (×3): qty 1

## 2015-05-20 MED ORDER — ONDANSETRON HCL 4 MG/2ML IJ SOLN: 4.0000 mg | Freq: Four times a day (QID) | INTRAMUSCULAR | Status: DC | PRN

## 2015-05-20 MED ORDER — ASPIRIN EC 325 MG PO TBEC
325.0000 mg | DELAYED_RELEASE_TABLET | Freq: Every day | ORAL | Status: DC
  Administered 2015-05-20 – 2015-05-22 (×3): 325 mg via ORAL
  Filled 2015-05-20 (×3): qty 1

## 2015-05-20 MED ORDER — ONDANSETRON HCL 4 MG/2ML IJ SOLN
INTRAMUSCULAR | Status: AC
  Filled 2015-05-20: qty 2

## 2015-05-20 MED ORDER — OXYCODONE HCL 5 MG PO TABS
5.0000 mg | ORAL_TABLET | ORAL | Status: DC | PRN
  Administered 2015-05-20 – 2015-05-23 (×11): 10 mg via ORAL
  Filled 2015-05-20 (×12): qty 2

## 2015-05-20 MED ORDER — FLUTICASONE PROPIONATE 50 MCG/ACT NA SUSP
2.0000 | Freq: Every day | NASAL | Status: DC | PRN
  Filled 2015-05-20: qty 16

## 2015-05-20 MED ORDER — FENTANYL CITRATE (PF) 100 MCG/2ML IJ SOLN
25.0000 ug | INTRAMUSCULAR | Status: DC | PRN
  Administered 2015-05-20 (×3): 50 ug via INTRAVENOUS

## 2015-05-20 MED ORDER — MORPHINE SULFATE (PF) 2 MG/ML IV SOLN: 0.5000 mg | INTRAVENOUS | Status: DC | PRN

## 2015-05-20 MED ORDER — CARBIDOPA-LEVODOPA ER 25-100 MG PO TBCR
2.0000 | EXTENDED_RELEASE_TABLET | Freq: Once | ORAL | Status: DC
  Administered 2015-05-20: 2 via ORAL

## 2015-05-20 MED ORDER — METOCLOPRAMIDE HCL 5 MG PO TABS: 5.0000 mg | ORAL_TABLET | Freq: Three times a day (TID) | ORAL | Status: DC | PRN

## 2015-05-20 MED ORDER — MENTHOL 3 MG MT LOZG: 1.0000 | LOZENGE | OROMUCOSAL | Status: DC | PRN

## 2015-05-20 MED ORDER — PHENOL 1.4 % MT LIQD: 1.0000 | OROMUCOSAL | Status: DC | PRN

## 2015-05-20 MED ORDER — DOCUSATE SODIUM 100 MG PO CAPS
100.0000 mg | ORAL_CAPSULE | Freq: Two times a day (BID) | ORAL | Status: DC
  Administered 2015-05-20 – 2015-05-23 (×7): 100 mg via ORAL
  Filled 2015-05-20 (×8): qty 1

## 2015-05-20 NOTE — Op Note (Signed)
NAMEBRYONY, Tina Shah NO.:  [id number]  MEDICAL RECORD NO.:  [id number]  LOCATION:  5N06C                        FACILITY:  [facility]  PHYSICIAN:  Mark C. Ophelia Charter, M.D.    DATE OF BIRTH:  [birth year]  DATE OF PROCEDURE:  05/19/2015 DATE OF DISCHARGE:                              OPERATIVE REPORT   PREOPERATIVE DIAGNOSES: 1. Fall with Monteggia proximal ulna fracture and radial head     posterior dislocation with radial head fracture(closed injury). 2. Right comminuted intertrochanteric fracture.  PROCEDURE: 1. Open reduction and internal fixation plating, right olecranon fracture.  Radial head arthroplasty.  Biomet anatomic proximal ulnar plate.  Biomet #7 stem, 14-mm +10 head.   2. ORIF intertroch fracture with  340 x11 mm nail with 95 mm interlock and de-rotational screw with distal interlock Affixus Biomet nail.  ANESTHESIA:  General plus Marcaine local elbow and hip.  INDICATIONS:  This 74 year old female, was going to a restaurant was diagnosed with parkinsonism within the last year and half.  When she got out of the car, she fell with abrasion of her hand, fracture dislocation with comminuted proximal ulnar fracture closed and radial head dislocation right elbow.  Right intertrochanteric fracture with lesser troch separate fragment.  DESCRIPTION OF PROCEDURE:  Due to her parkinsonism, she was taken to the operating room.  She had been n.p.o. since breakfast, time had been over 10 hours.  After induction of general anesthesia, Ancef prophylactically which was originally given at 8:00 p.m. and then was re-dosed at 11 prior to starting the hip.  Proximal arm tourniquet was used standard prepping from the tourniquet to the tips of the fingers,  stockinette, extremity sheets and drapes.  The patient was on a fracture table with legs flat on the extended attachment and straps securing the lower extremities.  After time-out procedure, a posterior incision was  made over the olecranon.  The patient had a large arm and ulna was identified distally and then proximally to the fracture site.  There was significant comminution with some medial and lateral comminuted fracture fragments.  The fragment medial were not close enough that would bother the ulnar groove.  The hematoma was washed out.  Fracture was reduced, held with a pin.  Plate was selected with some difficulty.  The initial screw was placed.  The slotted screw with a washer was placed so the plate could be adjusted.  Three times trying to put the lag screw which was the most proximal screw through the tip of the olecranon, the fracture displaced and then had to be re-reduced.  Finally, with positioning and holding, the screw was placed and additional screws were placed on each side securing the proximal fragment and then additional locking screws distally.  Some of the screws were long coming through the ulna and would bother the radius so they were exchanged for shorter screws that were bicortical.  Forearm still did not rotate well and the radial head was posteriorly dislocated.  It could be reduced with extension but then with rotation continued to clunk with subluxation. Looking under fluoro and gradually rotating, it was evident that about a third of the head had  been knocked off with the fracture.  Because of instability, incision was made through the extensors exposing the neck of the radius and radial head.  Fragment was removed.  Radial head was cut for arthroplasty.  Sequential progression to #7 stem which gave a tight fit.  Initially, a 14 +10 was tried and then 14 +12 was selected, permanent prosthesis was placed, 7 stem gave excellent tight fit. Prosthesis was placed with great difficult and finally reduced.  Once it was reduced, there was immediate improvement in stability of the elbow. The elbow could be extended and flexed, no longer was popping and clicking.  It was  difficult and had to be forced in order to get the radial head to sublux posteriorly and it is done.  Olecranon incision was closed first so that radial head could be seen directly reduced as it was being closed repairing the muscle fascia and subcutaneous tissue with Vicryl sutures and then skin with staple closure.  Splint was applied, and then before the final pieces were applied C-arm was checked again making sure the radial head was reduced.  The arm was in 90 degrees of flexion and forearm in neutral.  Once the splint was hard, the arm was placed across the chest with careful padding.  Opposite leg was placed in the well leg holder.  Unna boot traction attachment, distraction, and internal rotation.  C-arm brought in for reduction of the hip.  Prepping with DuraPrep.  A second time-out procedure was performed.  Re-dose of Ancef.  Incision was made proximally to the troch, gluteus medius fascia was split, checked under C-arm pin, initially placed at the tip, over-reamed with the cannulated drill, and then placement of the __________ down, it was at 360, we selected a 340. 11 x 340 nail was passed down to the knee, checked, and once it was in good position with the side-arm attachment, screw was drilled, checked, measured, it was very low in the neck on AP exactly center on lateral. A 95 mm screw was selected and placed.  Initially, the locking mechanism at the top had been twisted down slightly, and when the de-rotational screws tried to be placed adjacent to it since patient had Parkinson's, it was felt that a second screw would give her a little bit more stability so she could immediately weight bear on the hip as much as possible, so her Parkinson's would not give her more problems.  On placement of the derotational screw, tip of the drill bit broke.  The proximal locking screw was backed out as much as possible.  C-arm was checked on AP, lateral and oblique.  The drill bit broke  off inside the bone, it was next to the rod.  It was felt that would cause more problems so we made a corticotomy to remove the drill bit, weaken the bone and could possibly cause device to fail, and it was left in place where it would not cause any harm to the patient.  A second drill bit was opened, drilled after backing out the locking screw even more proximally and then the de-rotational screw backing off 15 mm in length was then inserted and placed in good position.  Final spot AP and lateral pictures were taken and then the proximal locking screw was tightened down.  Side-arm was removed and then freehand technique was used for a single distal interlock 40 mm at the knee through the static hole.  All wounds were irrigated.  Deep fascia was closed  with #1 Vicryl, 2-0 Vicryl for subcutaneous tissue, skin staple closure, Marcaine infiltration, postop dressing, and transferred to the recovery room.  Instrument count and needle count was correct.  Photographs were taken and discussed with the family about broken drill bit as well as radial head.  Prosthesis needed to be used for stability of the elbow due to persistent subluxation of the radial head due to the radial head fracture that occurred at the time of injury even with the ulna stabilized.  The patient tolerated the procedure well, was neurologically intact in the recovery room.     Mark C. Ophelia Charter, M.D.     MCY/MEDQ  D:  05/20/2015  T:  05/20/2015  Job:  [id number]

## 2015-05-20 NOTE — Transfer of Care (Signed)
Immediate Anesthesia Transfer of Care Note  Patient: Janet BerlinJoann P Hamor  Procedure(s) Performed: Procedure(s): OPEN REDUCTION INTERNAL FIXATION (ORIF) ELBOW/OLECRANON FRACTURE (Right) ORIF INTERTROCH HIP FX (Right) RADIAL HEAD ARTHROPLASTY (Right)  Patient Location: PACU  Anesthesia Type:General  Level of Consciousness: awake  Airway & Oxygen Therapy: Patient Spontanous Breathing  Post-op Assessment: Report given to RN and Post -op Vital signs reviewed and stable  Post vital signs: Reviewed and stable  Last Vitals:  Filed Vitals:   05/20/15 0025  BP:   Pulse:   Temp: 35.9 C  Resp:     Complications: No apparent anesthesia complications

## 2015-05-20 NOTE — Progress Notes (Signed)
Subjective: 1 Day Post-Op Procedure(s) (LRB): OPEN REDUCTION INTERNAL FIXATION (ORIF) ELBOW/OLECRANON FRACTURE (Right) ORIF INTERTROCH HIP FX (Right) RADIAL HEAD ARTHROPLASTY (Right) Patient reports pain as 4 on 0-10 scale.    Objective: Vital signs in last 24 hours: Temp:  [96.7 F (35.9 Shah)-98.9 F (37.2 Shah)] 97.9 F (36.6 Shah) (11/20 0523) Pulse Rate:  [86-94] 87 (11/20 0523) Resp:  [12-21] 12 (11/20 0523) BP: (116-173)/(37-90) 116/49 mmHg (11/20 0523) SpO2:  [98 %-100 %] 98 % (11/20 0523)  Intake/Output from previous day: 11/19 0701 - 11/20 0700 In: 1788.8 [I.V.:1788.8] Out: 550 [Urine:475; Blood:75] Intake/Output this shift: Total I/O In: 270 [P.O.:120; I.V.:150] Out: -    Recent Labs  05/19/15 1645 05/20/15 0531  HGB 12.0 10.6*    Recent Labs  05/19/15 1645 05/20/15 0531  WBC 12.5* 11.0*  RBC 4.25 3.80*  HCT 36.7 32.8*  PLT 262 252    Recent Labs  05/19/15 1645 05/20/15 0531  NA 139 139  K 3.5 3.6  CL 109 106  CO2 22 26  BUN 10 13  CREATININE 0.63 0.65  GLUCOSE 115* 129*  CALCIUM 9.3 8.4*   No results for input(s): LABPT, INR in the last 72 hours.  Neurologically intact  Assessment/Plan: 1 Day Post-Op Procedure(s) (LRB): OPEN REDUCTION INTERNAL FIXATION (ORIF) ELBOW/OLECRANON FRACTURE (Right) ORIF INTERTROCH HIP FX (Right) RADIAL HEAD ARTHROPLASTY (Right) Up with therapy  Tina Shah 05/20/2015, 1:59 PM

## 2015-05-20 NOTE — Brief Op Note (Signed)
05/19/2015 - 05/20/2015  12:26 AM  PATIENT:  Tina BerlinJoann P Siebenaler  74 y.o. female  PRE-OPERATIVE DIAGNOSIS:  HIP FX and Elbow FX (  [place]  With ulna comminuted fracture and radial head fx dislocation )  POST-OPERATIVE DIAGNOSIS:  HIP FX and Elbow FX (  "                           "                "    "                                 "                      "    )  PROCEDURE:  Procedure(s): OPEN REDUCTION INTERNAL FIXATION (ORIF) ELBOW/OLECRANON FRACTURE (Right) ORIF INTERTROCH HIP FX (Right) RADIAL HEAD ARTHROPLASTY (Right)  SURGEON:  Surgeon(s) and Role:    * Eldred MangesMark C Yates, MD - Primary  PHYSICIAN ASSISTANT:   ASSISTANTS: none   ANESTHESIA:   local and general  EBL:  Total I/O In: 1500 [I.V.:1500] Out: 400 [Urine:325; Blood:75]  BLOOD ADMINISTERED:none  DRAINS: none   LOCAL MEDICATIONS USED:  MARCAINE     SPECIMEN:  No Specimen  DISPOSITION OF SPECIMEN:  N/A  COUNTS:  YES  TOURNIQUET:  * Missing tourniquet times found for documented tourniquets in log:  [id number] * Total Tourniquet Time Documented: Upper Arm (Right) - 70 minutes Total: Upper Arm (Right) - 70 minutes   DICTATION: .Other Dictation: Dictation Number [id number]  PLAN OF CARE: Admit to inpatient   PATIENT DISPOSITION:  PACU - hemodynamically stable.   Delay start of Pharmacological VTE agent (>24hrs) due to surgical blood loss or risk of bleeding: yes

## 2015-05-20 NOTE — Progress Notes (Signed)
Family Medicine Teaching Service Daily Progress Note Intern Pager: [phone]  Patient name: Tina Shah Hathorne Medical record number: [id number] Date of birth: 05/23/1941 Age: 74 y.o. Gender: female  Primary Care Provider: No primary care provider on file. Consultants: Orthopedic Surgery Code Status: Full  Pt Overview and Major Events to Date:  11/19 - Admitted for right elbow and right hip fx after fall; s/Shah surgery  Assessment and Plan: Tina Shah Wassenaar is a 74 y.o. female who presented with right elbow pain/swelling and right hip pain after fall. PMH is significant for osteoporosis, Parkinson's disease, breast cancer s/Shah right breast mastectomy.  1. Right elbow fracture and right hip fracture: X-ray right elbow showed comminuted proximal ulnar fracture with suspicion of posterior dislocation of the radial head. X-ray of the right hip showed comminuted angulated overriding intertrochanteric fracture of the right proximal femur. She remained neurovascularly intact after. Most likely had osteoporosis. She is now Post-op day #1 s/Shah ORIF on right hip, ORIF on right elbow, and radial head arthroplasty. Pain well controlled.  - s/Shah surgery POD #1 - othro following - pain management with norco, oxycodone, and dilaudid. Wean as tolerated - Zofran for nausea - PT/OT evaluation - possible candidate for CIR -early mobilization as tolerated -Will delay pharmacological VTE agent for >24/hrs post-op per surgery recs; SCDs in place  2. Osteoporosis: Baseline osteoporosis likely contributed to Pt's fractures. - Holding home meds: Alendronate 70mg  weekly, calcium + vitamin D.  3. Parkinson's disease: On exam, Pt has pill-rolling tremor bilaterally. She lives alone and is able to perform her IDLs and IADLs without difficulty. - Continue home med: Pramipexole 0.125mg  qhs and Sinemet 25-100mg  2 tablets tid  FEN/GI: Regular diet, SLIV tolerating PO, Miralax and colace for bowel regimen. Prophylaxis: SCDs for now.  Will start anticoagulation tomorrow if appropriate.  Disposition: Likely short-term rehab. Pending PT/OT evaluation.   Subjective:  Doing well this morning. Has no complaints. Surgery went well without complications. Pain is controlled on current regimen. She is able to tolerate PO without nasuea or vomiting. Denies shortness of breath or chest pain.   Objective: Temp:  [96.7 F (35.9 C)-98.9 F (37.2 C)] 97.9 F (36.6 C) (11/20 0523) Pulse Rate:  [86-94] 87 (11/20 0523) Resp:  [12-21] 12 (11/20 0523) BP: (116-173)/(37-90) 116/49 mmHg (11/20 0523) SpO2:  [98 %-100 %] 98 % (11/20 0523) Physical Exam: General: Elderly female, laying in bed, in NAD, alert, pleasant HEENT: [place]/AT, EOMI, moist mucous membranes Cardiovascular: 2/6 holosystolic murmur heard best over the LUSB, RRR; pulses intact Respiratory: CTAB, normal effort Abdomen: +BS, soft, non-tender, non-distended MSK: No edema. R arm casted and splinted post-op, SCDs in place Skin: Warm and dry, no rashes Neuro: A&Ox3; non-focal but does have pill-rolling tremor in upper extremities bilaterally Psych: Appropriate mood and affect.  Laboratory:  Recent Labs Lab 05/19/15 1645 05/20/15 0531  WBC 12.5* 11.0*  HGB 12.0 10.6*  HCT 36.7 32.8*  PLT 262 252    Recent Labs Lab 05/19/15 1645 05/20/15 0531  NA 139 139  K 3.5 3.6  CL 109 106  CO2 22 26  BUN 10 13  CREATININE 0.63 0.65  CALCIUM 9.3 8.4*  GLUCOSE 115* 129*    Imaging/Diagnostic Tests: Dg Chest 1 View 05/19/2015  IMPRESSION: No significant abnormality.   Dg Elbow 2 Views Right 05/19/2015  IMPRESSION: Comminuted proximal ulna fracture. Anatomic distortion secondary to positioning and pain. Suspicion of posterior dislocation of the radial head.   Ct Elbow Right W/o Cm 05/19/2015  IMPRESSION:  1. Comminuted fracture of the olecranon process without articular surface involvement, with posterior displacement of the the major distal fracture fragment.  Ulnohumeral alignment is normal. 2. Posterior dislocation of the radial head relative to the capitellum with the posteriorly dislocated radial head located posterior and distal to the capitellum. Fracture of the volar aspect of the radial head with the displaced radial head fracture fragment located along the anterior superior aspect of the capitellum with approximately 17 mm of distraction between the fracture fragment and the remainder of the radial head. 3. Small fracture fragment between the radial head and proximal ulna. 4. Large hematoma in the subcutaneous fat overlying the ulnar aspect of the elbow joint.   Dg Hip Unilat With Pelvis 2-3 Views Right 05/19/2015  IMPRESSION: Comminuted angulated overriding intertrochanteric fracture of the proximal right femur. Osteoarthritis of the right hip joint.    Pincus Large, DO 05/20/2015, 9:54 AM PGY-2, [facility] FPTS Intern pager: [phone], text pages welcome

## 2015-05-20 NOTE — Progress Notes (Signed)
Utilization review completed.  

## 2015-05-20 NOTE — Anesthesia Postprocedure Evaluation (Signed)
  Anesthesia Post-op Note  Patient: Tina BerlinJoann P Shah  Procedure(s) Performed: Procedure(s): OPEN REDUCTION INTERNAL FIXATION (ORIF) ELBOW/OLECRANON FRACTURE (Right) ORIF INTERTROCH HIP FX (Right) RADIAL HEAD ARTHROPLASTY (Right)  Patient Location: PACU  Anesthesia Type:General  Level of Consciousness: awake and alert   Airway and Oxygen Therapy: Patient Spontanous Breathing  Post-op Pain: Controlled  Post-op Assessment: Post-op Vital signs reviewed, Patient's Cardiovascular Status Stable and Respiratory Function Stable  Post-op Vital Signs: Reviewed  Filed Vitals:   05/20/15 0115  BP: 158/64  Pulse: 89  Temp: 36.4 C  Resp: 20    Complications: No apparent anesthesia complications

## 2015-05-21 ENCOUNTER — Inpatient Hospital Stay (HOSPITAL_COMMUNITY): Payer: Medicare PPO

## 2015-05-21 ENCOUNTER — Encounter (HOSPITAL_COMMUNITY): Payer: Self-pay | Admitting: Physical Medicine & Rehabilitation

## 2015-05-21 DIAGNOSIS — G20 Parkinson's disease: Secondary | ICD-10-CM

## 2015-05-21 DIAGNOSIS — S72141S Displaced intertrochanteric fracture of right femur, sequela: Secondary | ICD-10-CM

## 2015-05-21 DIAGNOSIS — R509 Fever, unspecified: Secondary | ICD-10-CM | POA: Insufficient documentation

## 2015-05-21 DIAGNOSIS — G8918 Other acute postprocedural pain: Secondary | ICD-10-CM

## 2015-05-21 DIAGNOSIS — S52271A Monteggia's fracture of right ulna, initial encounter for closed fracture: Secondary | ICD-10-CM

## 2015-05-21 DIAGNOSIS — Z853 Personal history of malignant neoplasm of breast: Secondary | ICD-10-CM

## 2015-05-21 DIAGNOSIS — R011 Cardiac murmur, unspecified: Secondary | ICD-10-CM

## 2015-05-21 DIAGNOSIS — D62 Acute posthemorrhagic anemia: Secondary | ICD-10-CM

## 2015-05-21 LAB — URINE MICROSCOPIC-ADD ON

## 2015-05-21 LAB — BASIC METABOLIC PANEL
ANION GAP: 6 (ref 5–15)
BUN: 13 mg/dL (ref 6–20)
CALCIUM: 7.9 mg/dL — AB (ref 8.9–10.3)
CO2: 24 mmol/L (ref 22–32)
CREATININE: 0.7 mg/dL (ref 0.44–1.00)
Chloride: 104 mmol/L (ref 101–111)
GLUCOSE: 127 mg/dL — AB (ref 65–99)
Potassium: 3.7 mmol/L (ref 3.5–5.1)
Sodium: 134 mmol/L — ABNORMAL LOW (ref 135–145)

## 2015-05-21 LAB — URINALYSIS, ROUTINE W REFLEX MICROSCOPIC
GLUCOSE, UA: NEGATIVE mg/dL
KETONES UR: 15 mg/dL — AB
Nitrite: NEGATIVE
PH: 5 (ref 5.0–8.0)
PROTEIN: 30 mg/dL — AB
Specific Gravity, Urine: 1.024 (ref 1.005–1.030)

## 2015-05-21 LAB — CBC
HCT: 26.6 % — ABNORMAL LOW (ref 36.0–46.0)
Hemoglobin: 8.7 g/dL — ABNORMAL LOW (ref 12.0–15.0)
MCH: 28.2 pg (ref 26.0–34.0)
MCHC: 32.7 g/dL (ref 30.0–36.0)
MCV: 86.1 fL (ref 78.0–100.0)
PLATELETS: 194 10*3/uL (ref 150–400)
RBC: 3.09 MIL/uL — ABNORMAL LOW (ref 3.87–5.11)
RDW: 14 % (ref 11.5–15.5)
WBC: 9 10*3/uL (ref 4.0–10.5)

## 2015-05-21 MED ORDER — ENOXAPARIN SODIUM 30 MG/0.3ML ~~LOC~~ SOLN
30.0000 mg | Freq: Two times a day (BID) | SUBCUTANEOUS | Status: DC
  Administered 2015-05-21 – 2015-05-23 (×4): 30 mg via SUBCUTANEOUS
  Filled 2015-05-21 (×4): qty 0.3

## 2015-05-21 NOTE — Progress Notes (Signed)
Family Medicine Teaching Service Daily Progress Note Intern Pager: [phone]  Patient name: Tina Shah Medical record number: [id number] Date of birth: 06/09/1941 Age: 74 y.o. Gender: female  Primary Care Provider: No primary care provider on file. Consultants: Orthopedic Surgery Code Status: Full  Pt Overview and Major Events to Date:  11/19 - Admitted for right elbow and right hip fx after fall 11/20: Received ORIF of right elbow and right hip   Assessment and Plan: Tina Shah is a 74 y.o. female who presented with right elbow pain/swelling and right hip pain after fall. PMH is significant for osteoporosis, Parkinson's disease, breast cancer s/p right breast mastectomy.  Right elbow fracture and right hip fracture: X-ray right elbow showed comminuted proximal ulnar fracture and posterior dislocation of the radial head. X-ray of the right hip showed comminuted angulated intertrochanteric fracture of the right proximal femur. She remained neurovascularly intact after. Most likely had osteoporosis. She is now Post-op day #2 s/p ORIF on right hip, ORIF on right elbow, and radial head arthroplasty. Pain is tolerable while she is laying down. The oxycodone is helping to relieve her pain. - s/p surgery POD #2 - othro following. Will follow-up recommendations. - Early mobilization as tolerated - Pain management with norco 5-325mg  q6hrs prn, oxycodone 5-10mg  q4hrs prn, and dilaudid 0.5mg  q2hrs prn. Over the last 24 hours, she has received 0 doses of dilaudid, 5 doses of oxycodone, 1 dose of norco. Will d/c dilaudid and norco today and continue oxycodone. - Zofran for nausea - PT/OT evaluation. Will follow-up with their recommendations. We believe Pt is a possible candidate for CIR. - SCDs in place. Will consider restarting pharmacologic DVT prophylaxis today, as Pt is at high risk of DVT after surgery.  Fever: Pt had a Tmax of 101.1 overnight. Pt has otherwise been afebrile throughout admission.  Possible causes include UTI, pneumonia, wound infection. UA from 11/20 showed rare bacteria, trace leukocytes, negative nitrites, 0-5 WBC so UTI is less likely. Pt was working with PT this am so I did not examine her wounds. Dressings were dry. Crackles were auscultated in her left lung base, but Pt does not endorse any shortness of breath. States she has been using her incentive spirometry many times throughout the day. - Encouraged incentive spirometry use. - Will continue to monitor   Osteoporosis: Baseline osteoporosis likely contributed to Pt's fractures. - Holding home meds: Alendronate 70mg  weekly, calcium + vitamin D.  Parkinson's disease: On exam, Pt has pill-rolling tremor bilaterally. She lives alone and is able to perform her IDLs and IADLs without difficulty. - Continue home med: Pramipexole 0.125mg  qhs and Sinemet 25-100mg  2 tablets tid  Normocytic anemia: Hgb dropped from 12 on admission > 11.0 pre-op > 8.7 this morning. Likely secondary to acute blood loss s/p surgery. - Will continue to monitor.  FEN/GI: Regular diet, SLIV tolerating PO. Bowel regimen: Dulcolax suppository 10mg  daily prn, colace 100mg  bid, miralax daily. Prophylaxis: Pt has been on SCDs per ortho recs. Will consider restarting pharmacologic DVT prophylaxis today.  Disposition: CIR vs SNF. Pending PT/OT evaluation.   Subjective:  Pt is doing well. She states that she is not having much pain while lying still, but has increased pain when she moves. The oxycodone is helping to control her pain. She denies any dysuria or shortness of breath.  Objective: Temp:  [99.5 F (37.5 C)-101.1 F (38.4 C)] 101.1 F (38.4 C) (11/21 0526) Pulse Rate:  [101-105] 105 (11/21 0526) Resp:  [16-18] 18 (11/21 0526)  BP: (124-149)/(48-56) 129/52 mmHg (11/21 0526) SpO2:  [94 %-99 %] 94 % (11/21 0526) Physical Exam: General: Elderly female, sitting up in chair, in NAD, alert, pleasant HEENT: [place]/AT, EOMI, moist mucous  membranes Cardiovascular: 2/6 holosystolic murmur heard best over the LUSB, RRR; pulses intact Respiratory: Crackles in left lung base, otherwise lungs are clear, normal work of breathing. Abdomen: +BS, soft, non-tender, non-distended MSK: No edema. R arm casted and splinted post-op Skin: Warm and dry, no rashes Neuro: A&Ox3; non-focal but does have pill-rolling tremor in upper extremities bilaterally Psych: Appropriate mood and affect.  Laboratory:  Recent Labs Lab 05/19/15 1645 05/20/15 0531 05/21/15 0348  WBC 12.5* 11.0* 9.0  HGB 12.0 10.6* 8.7*  HCT 36.7 32.8* 26.6*  PLT 262 252 194    Recent Labs Lab 05/19/15 1645 05/20/15 0531 05/21/15 0348  NA 139 139 134*  K 3.5 3.6 3.7  CL 109 106 104  CO2 BUN CREATININE 0.63 0.65 0.70  CALCIUM 9.3 8.4* 7.9*  GLUCOSE 115* 129* 127*    Imaging/Diagnostic Tests: Dg Chest 1 View 05/19/2015  IMPRESSION: No significant abnormality.   Dg Elbow 2 Views Right 05/19/2015  IMPRESSION: Comminuted proximal ulna fracture. Anatomic distortion secondary to positioning and pain. Suspicion of posterior dislocation of the radial head.   Ct Elbow Right W/o Cm 05/19/2015  IMPRESSION: 1. Comminuted fracture of the olecranon process without articular surface involvement, with posterior displacement of the the major distal fracture fragment. Ulnohumeral alignment is normal. 2. Posterior dislocation of the radial head relative to the capitellum with the posteriorly dislocated radial head located posterior and distal to the capitellum. Fracture of the volar aspect of the radial head with the displaced radial head fracture fragment located along the anterior superior aspect of the capitellum with approximately 17 mm of distraction between the fracture fragment and the remainder of the radial head. 3. Small fracture fragment between the radial head and proximal ulna. 4. Large hematoma in the subcutaneous fat overlying the ulnar aspect  of the elbow joint.   Dg Hip Unilat With Pelvis 2-3 Views Right 05/19/2015  IMPRESSION: Comminuted angulated overriding intertrochanteric fracture of the proximal right femur. Osteoarthritis of the right hip joint.    Campbell Stall, MD 05/21/2015, 6:59 AM PGY-1, [facility] FPTS Intern pager: [phone], text pages welcome

## 2015-05-21 NOTE — Evaluation (Signed)
Occupational Therapy Evaluation Patient Details Name: Tina BerlinJoann P Shah MRN: [id number] DOB: 03/28/1941 Today's Date: 05/21/2015    History of Present Illness R hip ORIF, R elbow ORIF following a fall getting out of a van. PMhx includes osteoporosis, Parkinsonism, breast cancer, and pituitary tumor    Clinical Impression   Pt admitted with above. She demonstrates the below listed deficits and will benefit from continued OT to maximize safety and independence with BADLs.  Pt is very motivated, and was independent and active PTA.  Currently, she requires mod - max A for ADLs.  Feel she would benefit from CIR to allow her to maximize safety and independence with ADLs and allow her to return home at supervision - mod I level.       Follow Up Recommendations  CIR;Supervision/Assistance - 24 hour    Equipment Recommendations  3 in 1 bedside comode    Recommendations for Other Services       Precautions / Restrictions Precautions Required Braces or Orthoses: Sling Restrictions Weight Bearing Restrictions: Yes RUE Weight Bearing: Non weight bearing RLE Weight Bearing: Weight bearing as tolerated      Mobility Bed Mobility Overal bed mobility: Needs Assistance Bed Mobility: Supine to Sit;Sit to Supine     Supine to sit: Max assist Sit to supine: Mod assist   General bed mobility comments: requires assist to move Rt LE and to lift trunk.   Transfers Overall transfer level: Needs assistance Equipment used: Ambulation equipment used Antony Salmon(Stedy) Transfers: Sit to/from RaytheonStand;Stand Pivot Transfers Sit to Stand: Max assist;Mod assist Stand pivot transfers: Mod assist       General transfer comment: Pt performed sqat pivot transfer to left with max A.  Used stey with mod A to move sit to stand to transfer back to bed     Balance Overall balance assessment: Needs assistance Sitting-balance support: Feet supported Sitting balance-Leahy Scale: Good       Standing balance-Leahy  Scale: Poor Standing balance comment: mod - max a                             ADL Overall ADL's : Needs assistance/impaired Eating/Feeding: Set up;Bed level   Grooming: Wash/dry hands;Wash/dry face;Oral care;Brushing hair;Set up;Bed level;Sitting   Upper Body Bathing: Moderate assistance;Sitting;Bed level   Lower Body Bathing: Maximal assistance;Sit to/from stand   Upper Body Dressing : Maximal assistance;Sitting   Lower Body Dressing: Total assistance;Sit to/from stand   Toilet Transfer: Moderate assistance;Stand-pivot;BSC;Squat-pivot;Maximal assistance (Stedy) Toilet Transfer Details (indicate cue type and reason): Used stedy to transfer commode to bed.  And max A to squat pivot to [facility]es[facility] on her Lt.  Toileting- Clothing Manipulation and Hygiene: Total assistance;Sit to/from stand       Functional mobility during ADLs: Maximal assistance;Moderate assistance General ADL Comments: Pt is very motivated to progress      Vision     Perception     Praxis      Pertinent Vitals/Pain Pain Assessment: 0-10 Pain Score: 4  Pain Location: Rt hip, and Rt elbow  Pain Descriptors / Indicators: Aching Pain Intervention(s): Monitored during session     Hand Dominance Right   Extremity/Trunk Assessment Upper Extremity Assessment Upper Extremity Assessment: RUE deficits/detail;LUE deficits/detail RUE Deficits / Details: edema noted Rt hand.  Full AROM Within confines of splint  AAROM Rt shoulder WFL.  Elbow ROM not assessed due to splint  LUE Deficits / Details: tremor noted    Lower Extremity  Assessment Lower Extremity Assessment: Defer to PT evaluation   Cervical / Trunk Assessment Cervical / Trunk Assessment: Kyphotic   Communication Communication Communication: No difficulties   Cognition Arousal/Alertness: Awake/alert Behavior During Therapy: WFL for tasks assessed/performed Overall Cognitive Status: Within Functional Limits for tasks assessed                      General Comments       Exercises       Shoulder Instructions      Home Living Family/patient expects to be discharged to:: Private residence Living Arrangements: Alone Available Help at Discharge: Family Type of Home: House Home Access: Stairs to enter Secretary/administrator of Steps: 2 Entrance Stairs-Rails: Right Home Layout: One level     Bathroom Shower/Tub: Producer, television/film/video: Standard     Home Equipment: Environmental consultant - 2 wheels;Bedside commode;Shower seat   Additional Comments: Pt reports her sister will be available to assist as needed       Prior Functioning/Environment Level of Independence: Independent             OT Diagnosis: Generalized weakness;Acute pain   OT Problem List: Decreased strength;Decreased activity tolerance;Impaired balance (sitting and/or standing);Decreased safety awareness;Decreased knowledge of use of DME or AE;Decreased knowledge of precautions;Impaired UE functional use;Pain   OT Treatment/Interventions: Self-care/ADL training;Therapeutic exercise;DME and/or AE instruction;Therapeutic activities;Patient/family education;Balance training    OT Goals(Current goals can be found in the care plan section) Acute Rehab OT Goals Patient Stated Goal: return home OT Goal Formulation: With patient Time For Goal Achievement: 06/04/15 Potential to Achieve Goals: Good ADL Goals Pt Will Perform Grooming: with min assist;standing Pt Will Perform Upper Body Bathing: with set-up;sitting Pt Will Perform Lower Body Bathing: with min assist;sit to/from stand Pt Will Perform Upper Body Dressing: with set-up;with supervision;sitting Pt Will Perform Lower Body Dressing: with min assist;sit to/from stand Pt Will Transfer to Toilet: with min assist;ambulating;bedside commode;regular height toilet;grab bars Pt Will Perform Toileting - Clothing Manipulation and hygiene: with min assist;sit to/from stand Pt/caregiver will Perform Home  Exercise Program: Increased ROM;Increased strength;Right Upper extremity;Independently  OT Frequency: Min 2X/week   Barriers to D/C:            Co-evaluation              End of Session Equipment Utilized During Treatment: Other (comment) (stedy) Nurse Communication: Mobility status;Need for lift equipment  Activity Tolerance: Patient tolerated treatment well Patient left: in bed;with call bell/phone within reach;with family/visitor present   Time: 1131-1212 OT Time Calculation (min): 41 min Charges:  OT General Charges $OT Visit: 1 Procedure OT Evaluation $Initial OT Evaluation Tier I: 1 Procedure OT Treatments $Self Care/Home Management : 23-37 mins G-Codes:    Tina Shah M 06-06-2015, 1:58 PM

## 2015-05-21 NOTE — Progress Notes (Signed)
Inpatient Rehabilitation  I met with the patient and her family at the bedside to discuss hr post acute rehab needs and recommendation for CIR.  Pt. And family in strong agreement for CIR .  I will initiate insurance authorization process.  I have informed family that if pt. Is not approved by her insurance company or if I do not have bed availability, a back up plan needs to be in place.  Pt. Wants SNF in [place] if we cannnot offer her a bed.  I have updated Despina Pole, SW of need for SNF backup.  Please call if questions.  [place] Admissions Coordinator Cell [phone] Office [phone]

## 2015-05-21 NOTE — Progress Notes (Signed)
Subjective: Doing well.  Pain controlled.    Objective: Vital signs in last 24 hours: Temp:  [99.5 F (37.5 C)-101.1 F (38.4 C)] 101.1 F (38.4 C) (11/21 0526) Pulse Rate:  [101-105] 105 (11/21 0526) Resp:  [16-18] 18 (11/21 0526) BP: (124-149)/(48-56) 129/52 mmHg (11/21 0526) SpO2:  [94 %-99 %] 94 % (11/21 0526)  Intake/Output from previous day: 11/20 0701 - 11/21 0700 In: 510 [P.O.:360; I.V.:150] Out: 1650 [Urine:1650] Intake/Output this shift:     Recent Labs  05/19/15 1645 05/20/15 0531 05/21/15 0348  HGB 12.0 10.6* 8.7*    Recent Labs  05/20/15 0531 05/21/15 0348  WBC 11.0* 9.0  RBC 3.80* 3.09*  HCT 32.8* 26.6*  PLT 252 194    Recent Labs  05/20/15 0531 05/21/15 0348  NA 139 134*  K 3.6 3.7  CL 106 104  CO2 26 24  BUN 13 13  CREATININE 0.65 0.70  GLUCOSE 129* 127*  CALCIUM 8.4* 7.9*   No results for input(s): LABPT, INR in the last 72 hours.  Exam:  Alert and oriented.  Very pleasant.  Splint right UE intact.  Moves fingers well.  Good capillary refill.  Dressings right LE intact.  Thigh soft nontender.  Calf nontender NVI.    Assessment/Plan: Seen by CIR and will hopefully they will accept her.  Will be nonweightbearing right upper extremity.  Will have difficulty using platform walker.     OWENS,JAMES M 05/21/2015, 11:42 AM

## 2015-05-21 NOTE — Consult Note (Addendum)
Physical Medicine and Rehabilitation Consult  Reason for Consult: Right hip fracture and right [place] ulna fracture and radial head dislocation.  Referring Physician:  Dr. Ophelia Charter.    HPI: Tina Shah is a 74 y.o. female with history of Breast cancer s/p mastectomy w/chemo, Parkinson's disease who fell at a restaurant on 05/19/15 with fracture of proximal right ulna and radial head dislocation and right IT hip fracture. She was evaluated by Dr. Ophelia Charter and underwent ORIF right hip and right radial head arthroplasty the same day.  Post op to be WBAT on RLE and NWB right forearm with platform walker? Follow up labs show reactive leucocytosis and ABLA. PT/OT evaluations in progress. Patient lives alone and son reports family trying to workout a plan. MD recommending CIR for follow up therapies.   Review of Systems  HENT: Positive for tinnitus. Negative for hearing loss.   Eyes: Negative for blurred vision and double vision.  Respiratory: Negative for cough, shortness of breath and wheezing.   Cardiovascular: Positive for palpitations. Negative for chest pain.  Gastrointestinal: Positive for constipation (managed with daily  miralax). Negative for heartburn.  Genitourinary: Positive for urgency and frequency.       Gets up 3-4 times at night--negative GU workup  Musculoskeletal: Positive for myalgias (left thigh) and joint pain (right hip and elbow sore due to fracture).  Skin: Negative for itching and rash.  Neurological: Positive for dizziness and tremors (resting tremors BUE occasionally). Negative for sensory change, focal weakness and headaches.  Psychiatric/Behavioral: Negative for memory loss.  All other systems reviewed and are negative.     Past Medical History  Diagnosis Date  . Parkinson disease [facility])     Past Surgical History  Procedure Laterality Date  . Breast surgery      right breast mastectomy  . Pituitary surgery    . Shoulder Left   . Abdominal hysterectomy     . Bladder repair    . Eye surgery Bilateral     cataract    Family History  Problem Relation Age of Onset  . Family history unknown: Yes    Social History:  Lives alone. Has sister and son in town who can check in after discharge and believes her sister may be able to provide 24/7 support. 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  She  reports that she has never smoked. She does not have any smokeless tobacco history on file. She reports that she does not drink alcohol or use illicit drugs.    Allergies: No Known Allergies    Medications Prior to Admission  Medication Sig Dispense Refill  . alendronate (FOSAMAX) 70 MG tablet Take 70 mg by mouth once a week. Take with a full glass of water on an empty stomach. - on Mondays    . aspirin EC 81 MG tablet Take 81 mg by mouth daily.    . Calcium Carb-Cholecalciferol (CALCIUM 600 + D PO) Take 2 tablets by mouth daily.    . Carbidopa-Levodopa ER (SINEMET CR) 25-100 MG tablet controlled release Take 2 tablets by mouth 3 (three) times daily. Breakfast (8am), 4pm and 10pm    . Cyanocobalamin (VITAMIN B-12) 1000 MCG SUBL Place 1,000 mcg under the tongue daily.    . fluticasone (FLONASE) 50 MCG/ACT nasal spray Place 2 sprays into both nostrils daily as needed (seasonal allergies).    Marland Kitchen ibuprofen (ADVIL,MOTRIN) 200 MG tablet Take 400-600 mg by mouth at bedtime as  needed (pain).    . Omega-3 Fatty Acids (FISH OIL) 1000 MG CAPS Take 1,000 mg by mouth 2 (two) times daily with a meal.    . polyethylene glycol (MIRALAX / GLYCOLAX) packet Take 17 g by mouth daily. Mix in coffee and drink    . Polyvinyl Alcohol-Povidone (REFRESH OP) Place 1 drop into both eyes 4 (four) times daily as needed (dry eyes).    . pramipexole (MIRAPEX) 0.125 MG tablet Take 0.125 mg by mouth at bedtime.    . pravastatin (PRAVACHOL) 40 MG tablet Take 40 mg by mouth at bedtime.      Home: Home Living Family/patient expects to be discharged to::  Private residence Living Arrangements: Alone Available Help at Discharge: Family (71 yo sister and niece ) Type of Home: House Home Access: Stairs to enter Secretary/administrator of Steps: 2 Entrance Stairs-Rails: Right Home Layout: One level  Functional History:   Functional Status:  Mobility: Bed Mobility Overal bed mobility: Needs Assistance Bed Mobility: Supine to Sit Supine to sit: Min assist General bed mobility comments: Needed assist to move R LE and assistance with pivoting, VCs to use bed rail and for sequencing Transfers Overall transfer level: Needs assistance Equipment used: Right platform walker (NWB on R UE so platform can only be used through humerus) Transfers: Sit to/from Stand, [employer] Transfers Sit to Stand: Mod assist Stand pivot transfers: Mod assist General transfer comment: Needed mod assist for anterior translation and powering up. R LE extremity was blocked. Required VC for hand placement and sequencing. Mod assist to control stand to sit      ADL:    Cognition: Cognition Overall Cognitive Status: Within Functional Limits for tasks assessed Orientation Level: Oriented X4 Cognition Arousal/Alertness: Awake/alert Behavior During Therapy: WFL for tasks assessed/performed Overall Cognitive Status: Within Functional Limits for tasks assessed  Blood pressure 129/52, pulse 105, temperature 101.1 F (38.4 C), temperature source Oral, resp. rate 18, SpO2 94 %. Physical Exam  Nursing note and vitals reviewed. Constitutional: She is oriented to person, place, and time. She appears well-developed and well-nourished. No distress.  HENT:  Head: Normocephalic and atraumatic.  Eyes: Conjunctivae and EOM are normal. Pupils are equal, round, and reactive to light.  Neck: Normal range of motion. Neck supple.  Cardiovascular: Normal rate and regular rhythm.   Murmur heard. Respiratory: Effort normal and breath sounds normal. No respiratory distress. She has  no wheezes.  GI: Soft. Bowel sounds are normal. She exhibits no distension. There is no tenderness.  Musculoskeletal: She exhibits edema and tenderness.  Right hip tenderness with minimal movement.   Moderate edema right thigh and knee.  RUE splinted, in a sling and fingers with edema.  Neurological: She is alert and oriented to person, place, and time.  Sensation intact to light touch Strength Right side limited due to pain LUE/LLE 4/5 grossly proximal to distal  Skin: Skin is warm and dry. She is not diaphoretic.  Dressings c/d/i  Psychiatric: She has a normal mood and affect. Her behavior is normal. Judgment and thought content normal.    Results for orders placed or performed during the hospital encounter of 05/19/15 (from the past 24 hour(s))  Urinalysis, Routine w reflex microscopic (not at [facility])     Status: Abnormal   Collection Time: 05/20/15  4:11 PM  Result Value Ref Range   Color, Urine YELLOW YELLOW   APPearance CLEAR CLEAR   Specific Gravity, Urine 1.017 1.005 - 1.030   pH 5.5 5.0 - 8.0  Glucose, UA NEGATIVE NEGATIVE mg/dL   Hgb urine dipstick NEGATIVE NEGATIVE   Bilirubin Urine NEGATIVE NEGATIVE   Ketones, ur NEGATIVE NEGATIVE mg/dL   Protein, ur NEGATIVE NEGATIVE mg/dL   Nitrite NEGATIVE NEGATIVE   Leukocytes, UA TRACE (A) NEGATIVE  Urine microscopic-add on     Status: Abnormal   Collection Time: 05/20/15  4:11 PM  Result Value Ref Range   Squamous Epithelial / LPF 0-5 (A) NONE SEEN   WBC, UA 0-5 0 - 5 WBC/hpf   RBC / HPF 0-5 0 - 5 RBC/hpf   Bacteria, UA RARE (A) NONE SEEN   Urine-Other MUCOUS PRESENT   CBC     Status: Abnormal   Collection Time: 05/21/15  3:48 AM  Result Value Ref Range   WBC 9.0 4.0 - 10.5 K/uL   RBC 3.09 (L) 3.87 - 5.11 MIL/uL   Hemoglobin 8.7 (L) 12.0 - 15.0 g/dL   HCT 30.826.6 (L) 65.736.0 - 84.646.0 %   MCV 86.1 78.0 - 100.0 fL   MCH 28.2 26.0 - 34.0 pg   MCHC 32.7 30.0 - 36.0 g/dL   RDW 96.214.0 95.211.5 - 84.115.5 %   Platelets 194 150 - 400 K/uL    Basic metabolic panel     Status: Abnormal   Collection Time: 05/21/15  3:48 AM  Result Value Ref Range   Sodium 134 (L) 135 - 145 mmol/L   Potassium 3.7 3.5 - 5.1 mmol/L   Chloride 104 101 - 111 mmol/L   CO2 24 22 - 32 mmol/L   Glucose, Bld 127 (H) 65 - 99 mg/dL   BUN 13 6 - 20 mg/dL   Creatinine, Ser 3.240.70 0.44 - 1.00 mg/dL   Calcium 7.9 (L) 8.9 - 10.3 mg/dL   GFR calc non Af Amer >60 >60 mL/min   GFR calc Af Amer >60 >60 mL/min   Anion gap 6 5 - 15   Dg Chest 1 View  05/19/2015  CLINICAL DATA:  Pre operative respiratory exam.  Right hip fracture. EXAM: CHEST 1 VIEW COMPARISON:  None. FINDINGS: Heart size and pulmonary vascularity are normal. Previous right mastectomy. 3 mm density overlying the anterior aspect of the right third rib is probably a granuloma. Lungs are otherwise clear. No acute osseous abnormality. IMPRESSION: No significant abnormality. Electronically Signed   By: Francene BoyersJames  Maxwell M.D.   On: 05/19/2015 16:01   Dg Elbow 2 Views Right  05/19/2015  CLINICAL DATA:  Fall.  Pain.  Initial encounter. EXAM: RIGHT ELBOW - 2 VIEW COMPARISON:  None. FINDINGS: Anatomic distortion, secondary to patient immobility and pain. Comminuted fracture of the proximal ulna. Suspect posterior dislocation of the radial head relative to the capitellum. Diffuse soft tissue swelling. IMPRESSION: Comminuted proximal ulna fracture. Anatomic distortion secondary to positioning and pain. Suspicion of posterior dislocation of the radial head. CT versus more complete radiographs should be considered for further characterization. Electronically Signed   By: Jeronimo GreavesKyle  Talbot M.D.   On: 05/19/2015 16:02   Dg Elbow Complete Right  05/20/2015  CLINICAL DATA:  Open reduction internal fixation right olecranon fracture and radial head arthroplasty. EXAM: RIGHT ELBOW - COMPLETE 3+ VIEW; DG C-ARM GT 120 MIN COMPARISON:  CT earlier this day. FINDINGS: Eleven fluoroscopic spot images from the operating room provided during  ORIF of right elbow fracture. There is plate and multi screw fixation of the comminuted olecranon fracture. A radial head arthroplasty is performed place. Final images demonstrates normal alignment. Fluoroscopic spot images during ORIF of right femur  fracture are also included. Total fluoroscopy time 1 minutes 42 seconds. IMPRESSION: Intraoperative fluoroscopy during ORIF right elbow fracture with radial head arthroplasty and plate and screw fixation of olecranon fracture. Electronically Signed   By: Rubye Oaks M.D.   On: 05/20/2015 00:47   Ct Elbow Right W/o Cm  05/19/2015  CLINICAL DATA:  Fracture of the radius and olecranon. Status post fall. EXAM: CT OF THE RIGHT ELBOW WITHOUT CONTRAST TECHNIQUE: Multidetector CT imaging was performed according to the standard protocol. Multiplanar CT image reconstructions were also generated. COMPARISON:  None. FINDINGS: There is a comminuted fracture of the olecranon process without articular surface involvement, with posterior displacement of the the major distal fracture fragment. The ulnohumeral alignment is normal. There is posterior dislocation of the radial head relative to the capitellum. The posteriorly dislocated radial head is located posterior and distal to the capitellum. There is a fracture of the volar aspect of the radial head with the displaced radial head fracture fragment located along the anterior superior aspect of the capitellum with approximately 17 mm of distraction between the fracture fragment and the remainder of the radial head. There is a small fracture fragment between the radial head and proximal ulna. The muscles are normal. There is hyperdense material in along the ulnar aspect of the elbow joint within the subcutaneous fat consistent with a large hematoma measuring approximately 2.9 x 5.2 x 9.4 cm. IMPRESSION: 1. Comminuted fracture of the olecranon process without articular surface involvement, with posterior displacement of the the  major distal fracture fragment. Ulnohumeral alignment is normal. 2. Posterior dislocation of the radial head relative to the capitellum with the posteriorly dislocated radial head located posterior and distal to the capitellum. Fracture of the volar aspect of the radial head with the displaced radial head fracture fragment located along the anterior superior aspect of the capitellum with approximately 17 mm of distraction between the fracture fragment and the remainder of the radial head. 3. Small fracture fragment between the radial head and proximal ulna. 4. Large hematoma in the subcutaneous fat overlying the ulnar aspect of the elbow joint. Electronically Signed   By: Elige Ko   On: 05/19/2015 19:41   Dg C-arm Gt 120 Min  05/20/2015  CLINICAL DATA:  Open reduction internal fixation right olecranon fracture and radial head arthroplasty. EXAM: RIGHT ELBOW - COMPLETE 3+ VIEW; DG C-ARM GT 120 MIN COMPARISON:  CT earlier this day. FINDINGS: Eleven fluoroscopic spot images from the operating room provided during ORIF of right elbow fracture. There is plate and multi screw fixation of the comminuted olecranon fracture. A radial head arthroplasty is performed place. Final images demonstrates normal alignment. Fluoroscopic spot images during ORIF of right femur fracture are also included. Total fluoroscopy time 1 minutes 42 seconds. IMPRESSION: Intraoperative fluoroscopy during ORIF right elbow fracture with radial head arthroplasty and plate and screw fixation of olecranon fracture. Electronically Signed   By: Rubye Oaks M.D.   On: 05/20/2015 00:47   Dg Hip Unilat With Pelvis 2-3 Views Right  05/19/2015  CLINICAL DATA:  Right hip pain after the patient fell exiting her car today. EXAM: DG HIP (WITH OR WITHOUT PELVIS) 2-3V RIGHT COMPARISON:  None. FINDINGS: There is a comminuted angulated over riding intertrochanteric fracture of the proximal right femur. The lesser trochanter is avulsed. The superior  aspect of the greater trochanter is comminuted. Marginal osteophytes on the right femoral head and right acetabulum. Bilateral calcifications in the origins of the hamstring tendons. IMPRESSION: Comminuted angulated  overriding intertrochanteric fracture of the proximal right femur. Osteoarthritis of the right hip joint. Electronically Signed   By: Francene Boyers M.D.   On: 05/19/2015 15:59   Dg Femur, Min 2 Views Right  05/20/2015  CLINICAL DATA:  ORIF intertrochanteric right hip fracture. EXAM: RIGHT FEMUR 2 VIEWS COMPARISON:  Radiographs earlier this date. FINDINGS: Five fluoroscopic spot images in the operating room during ORIF right hip fracture demonstrate intra medullary rod in the femoral shaft with proximal and distal locking screws. Total fluoroscopy time 1 minutes 18 seconds. Fluoroscopy during right elbow ORIF also included. IMPRESSION: Intraoperative fluoroscopy during right hip ORIF for intertrochanteric fracture. Electronically Signed   By: Rubye Oaks M.D.   On: 05/20/2015 00:48    Assessment/Plan: Diagnosis: Right hip fracture and right ulna fracture and radial head dislocation. Labs and images independently reviewed.  Records reviewed and summated above.  1. Does the need for close, 24 hr/day medical supervision in concert with the patient's rehab needs make it unreasonable for this patient to be served in a less intensive setting? Yes  2. Co-Morbidities requiring supervision/potential complications: Breast cancer s/p mastectomy w/chemo, Parkinson's disease (cont meds, educate on safety awareness), ABLA (transfuse if necessary to ensure appropriate perfusion for increased activity tolerance), fevers (cont to monitor and workup/treat as appropriate), post-op pain (Monitor pain control during therapies, and sedation at rest and titrate to maximum efficacy to ensure participation and gains in therapies) 3. Due to bowel management, safety, skin/wound care, medication administration,  pain management and patient education, does the patient require 24 hr/day rehab nursing? Yes 4. Does the patient require coordinated care of a physician, rehab nurse, PT (1.5-2 hrs/day, 5 days/week) and OT (1.5-2 hrs/day, 5 days/week) to address physical and functional deficits in the context of the above medical diagnosis(es)? Yes Addressing deficits in the following areas: balance, endurance, locomotion, strength, transferring, bowel/bladder control, bathing, dressing, grooming, toileting and psychosocial support 5. Can the patient actively participate in an intensive therapy program of at least 3 hrs of therapy per day at least 5 days per week? Yes 6. The potential for patient to make measurable gains while on inpatient rehab is excellent 7. Anticipated functional outcomes upon discharge from inpatient rehab are Min A/Supervision, however, will await PT eval  with PT, Min A/Supervision, however, will await OT eval with OT, n/a with SLP. 8. Estimated rehab length of stay to reach the above functional goals is: 14-16 days. 9. Does the patient have adequate social supports and living environment to accommodate these discharge functional goals? Potentially 10. Anticipated D/C setting: Home 11. Anticipated post D/C treatments: HH therapy and Home excercise program 12. Overall Rehab/Functional Prognosis: excellent  RECOMMENDATIONS: This patient's condition is appropriate for continued rehabilitative care in the following setting: CIR once workup for fevers completed ?echo, PT/OT recs reviewed, and confirmation of support on discharge. Patient has agreed to participate in recommended program. Yes Note that insurance prior authorization may be required for reimbursement for recommended care.  Comment: Rehab Admissions Coordinator to follow up.  Maryla Morrow, MD 05/21/2015

## 2015-05-21 NOTE — Progress Notes (Signed)
Pharmacy: Lovenox for VTE prophylaxis  OBJECTIVE:  Ht: 5\' 3"  Weight: 170 lb  ASSESSMENT:  [age] YOF s/p R-ORIF on 11/19. Pharmacy now consulted to start lovenox for VTE prophylaxis  PLAN:  1. Start Lovenox 30 mg SQ bid 2. Pharmacy will sign off - please re-consult us if needed  Georgina PillionElizabeth Braison Snoke, PharmD, BCPS Clinical Pharmacist Pager: [phone] 05/21/2015 2:45 PM

## 2015-05-21 NOTE — Progress Notes (Signed)
Echocardiogram 2D Echocardiogram has been performed.  Nolon RodBrown, Tony 05/21/2015, 11:38 AM

## 2015-05-21 NOTE — Evaluation (Signed)
Physical Therapy Evaluation Patient Details Name: Tina Shah MRN: [id number] DOB: 12/11/1940 Today's Date: 05/21/2015   History of Present Illness  R hip ORIF, R elbow ORIF following a fall getting out of a van. PMhx includes osteoporosis, Parkinsonism, breast cancer, and pituitary tumor   Clinical Impression  Pt was alert and oriented upon arrival. Pt presents with generalized weakness in LEs, decreased ROM, impaired gait. Parkinsonism was well controlled by medications. She has limited ability to WB through R LE due to pain and weakness. Mod assist required for stability during pivoting and standing throughout transfers to bedside commode and chair. Pt would benefit from skilled PT services to increase strength, functional mobility, and maximize transfers to decrease burden of care. Education for WB status, transfers, and POC and encouraged ROM and movement in R LE throughout the day.     Follow Up Recommendations CIR;Supervision/Assistance - 24 hour    Equipment Recommendations  Other (comment) (TBD)    Recommendations for Other Services Rehab consult;OT consult     Precautions / Restrictions Precautions Required Braces or Orthoses: Sling Restrictions Weight Bearing Restrictions: Yes RLE Weight Bearing: Weight bearing as tolerated Other Position/Activity Restrictions: MD cleared platform for R UE but awaits clarifying WB status      Mobility  Bed Mobility Overal bed mobility: Needs Assistance;+ 2 for safety/equipment Bed Mobility: Supine to Sit     Supine to sit: Mod assist;+2 for safety/equipment     General bed mobility comments: Needed assist to move R LE and assistance with pivoting, VCs to use bed rail and for sequencing, use of bed pad to pivot and elevate trunk  Transfers Overall transfer level: Needs assistance Equipment used: Right platform walker;None (NWB on R UE so platform can only be used through humerus) Transfers: Sit to/from [employer] Sit to Stand: Mod assist;+2 physical assistance Stand pivot transfers: Mod assist;+2 physical assistance       General transfer comment: Needed mod assist for anterior translation and powering up. R LE extremity was blocked throughout. Required VC for hand placement and sequencing. Mod assist to control stand to sit as well as pelvic translation with pivot  Ambulation/Gait                Stairs            Wheelchair Mobility    Modified Rankin (Stroke Patients Only)       Balance Overall balance assessment: Needs assistance Sitting-balance support: Feet supported Sitting balance-Leahy Scale: Good Sitting balance - Comments: Leans to left due to pain in R hip     Standing balance-Leahy Scale: Poor Standing balance comment: Mod assist through gait belt support to maintain balance in standing.                             Pertinent Vitals/Pain      Home Living Family/patient expects to be discharged to:: Private residence Living Arrangements: Alone Available Help at Discharge: Family (66 yo sister and niece ) Type of Home: House Home Access: Stairs to enter Entrance Stairs-Rails: Right Entrance Stairs-Number of Steps: 2 Home Layout: One level Home Equipment: Walker - 2 wheels;Bedside commode      Prior Function Level of Independence: Independent               Hand Dominance   Dominant Hand: Right    Extremity/Trunk Assessment   Upper Extremity Assessment: Defer to OT evaluation  Lower Extremity Assessment: RLE deficits/detail RLE Deficits / Details: Limited WB on R LE secondary to pain and weakness    Cervical / Trunk Assessment: Kyphotic  Communication   Communication: No difficulties  Cognition Arousal/Alertness: Awake/alert Behavior During Therapy: WFL for tasks assessed/performed Overall Cognitive Status: Within Functional Limits for tasks assessed                      General Comments       Exercises        Assessment/Plan    PT Assessment Patient needs continued PT services  PT Diagnosis Difficulty walking;Generalized weakness;Acute pain   PT Problem List Decreased strength;Decreased activity tolerance;Decreased balance;Decreased mobility;Decreased knowledge of use of DME;Decreased knowledge of precautions;Pain  PT Treatment Interventions DME instruction;Gait training;Stair training;Functional mobility training;Therapeutic activities;Patient/family education;Therapeutic exercise   PT Goals (Current goals can be found in the Care Plan section) Acute Rehab PT Goals Patient Stated Goal: return home PT Goal Formulation: With patient/family Time For Goal Achievement: 05/28/15 Potential to Achieve Goals: Fair    Frequency Min 3X/week   Barriers to discharge Decreased caregiver support      Co-evaluation               End of Session Equipment Utilized During Treatment: Gait belt Activity Tolerance: Patient limited by pain Patient left: in chair;with call bell/phone within reach;with family/visitor present Nurse Communication: Mobility status;Weight bearing status         Time: 0828-0900 PT Time Calculation (min) (ACUTE ONLY): 32 min   Charges:   PT Evaluation $Initial PT Evaluation Tier I: 1 Procedure PT Treatments $Therapeutic Activity: 8-22 mins   PT G CodesJimmy Picket:        Tam Delisle 05/21/2015, 9:45 AM Jimmy PicketJustin Iyla Balzarini, SPT 05/21/2015 9:45 AM

## 2015-05-22 DIAGNOSIS — R509 Fever, unspecified: Secondary | ICD-10-CM

## 2015-05-22 LAB — CBC
HCT: 28.1 % — ABNORMAL LOW (ref 36.0–46.0)
HEMOGLOBIN: 9.3 g/dL — AB (ref 12.0–15.0)
MCH: 28.4 pg (ref 26.0–34.0)
MCHC: 33.1 g/dL (ref 30.0–36.0)
MCV: 85.9 fL (ref 78.0–100.0)
Platelets: 215 10*3/uL (ref 150–400)
RBC: 3.27 MIL/uL — ABNORMAL LOW (ref 3.87–5.11)
RDW: 13.8 % (ref 11.5–15.5)
WBC: 10 10*3/uL (ref 4.0–10.5)

## 2015-05-22 MED ORDER — DEXTROSE 5 % IV SOLN
1.0000 g | INTRAVENOUS | Status: DC
  Administered 2015-05-23: 1 g via INTRAVENOUS
  Filled 2015-05-22 (×2): qty 10

## 2015-05-22 MED ORDER — ASPIRIN EC 325 MG PO TBEC: 325.0000 mg | DELAYED_RELEASE_TABLET | Freq: Every day | ORAL | Status: DC

## 2015-05-22 MED ORDER — ASPIRIN EC 81 MG PO TBEC
81.0000 mg | DELAYED_RELEASE_TABLET | Freq: Every day | ORAL | Status: DC
  Administered 2015-05-23: 81 mg via ORAL
  Filled 2015-05-22 (×2): qty 1

## 2015-05-22 MED ORDER — OXYCODONE-ACETAMINOPHEN 5-325 MG PO TABS: 1.0000 | ORAL_TABLET | Freq: Four times a day (QID) | ORAL | Status: DC | PRN

## 2015-05-22 NOTE — Clinical Social Work Note (Addendum)
CSW presented bed offers to patient and she chose [facility] in [place].  CSW contacted [facility], who said they can take patient tomorrow once insurance has been approved and patient is medically ready and discharge orders have been received.    CSW informed patient and her family that if insurance is not approved, patient will have to go to a different SNF, pay privately, or discharge home with home health.  CSW to continue to follow patient's progress throughout discharge planning.  Ervin KnackEric R. Fahima Cifelli, MSW, Theresia MajorsLCSWA [phone] 05/22/2015 1:03 PM

## 2015-05-22 NOTE — Discharge Summary (Signed)
Family Medicine Teaching [facility] Discharge Summary  Patient name: Tina Shah Medical record number: [id number] Date of birth: January 22, 1941 Age: 74 y.o. Gender: female Date of Admission: 05/19/2015  Date of Discharge: 05/23/15 Admitting Physician: Carney Living, MD  Primary Care Provider: No primary care provider on file. Consultants: Orthopedic Surgery  Indication for Hospitalization: Right proximal ulnar fracture, right intertrochanteric fracture of the proximal femur.  Discharge Diagnoses/Problem List:  Intertrochanteric fracture of the right hip Right proximal ulnar fracture Urinary tract infection Osteoporosis Parkinson's disease  Disposition: SNF  Discharge Condition: Stable  Discharge Exam:  General: Elderly female, sitting up in chair, in NAD, alert, pleasant HEENT: [place]/AT, EOMI, moist mucous membranes Cardiovascular: 3/6 holosystolic murmur heard best over the RUSB, RRR; pulses intact Respiratory: Minimal crackles in left lung base improved from yesterday, otherwise lungs are clear, normal work of breathing. Abdomen: +BS, soft, non-tender, non-distended MSK: No edema. R arm casted and splinted post-op; Homan's sign negative bilaterally, no tenderness to palpation of calves. Right LE: incisions are intact with minimal oozing. Minimal tenderness of thigh.  Skin: Warm and dry, no rashes Neuro: A&Ox3; non-focal but does have pill-rolling tremor in upper extremities bilaterally Psych: Appropriate mood and affect.  Brief Hospital Course:  Tina Shah is a 74 year old female with a PMH of osteoporosis and Parkinson's disease who presented to the emergency department after she fell on her right side while getting out of the car. In the ED, x-ray of her right elbow showed a comminuted proximal ulnar fracture with posterior dislocation of the radial head. X-ray of the right hip showed a comminuted intertrochanteric fracture of the right proximal femur. Orthopedic surgery  was consulted and took her to the OR on 11/19 for ORIF of the right elbow fracture, radial head arthroplasty, and ORIF of the right hip fracture. After her surgery, her pain was well controlled with oxycodone and tylenol. She was mobilized early and worked with PT/OT. For DVT prophylaxis, she was initially treated with SCDs and then started on Lovenox  q12hrs. She should continue DVT prophylaxis with Lovenox for 14 days after her surgery.  On post-op day #2, she was noted to have fevers up to 101.40F. She did not endorse any shortness of breath or have any new oxygen requirements, so we did not think she had a pneumonia. Her surgical sites did not show any signs of infection. She did not endorse any dysuria, but we ordered a UA, which showed few bacteria, small Hgb, small leukocytes, negative nitrites, and TNTC WBCs. She spiked another fever on post-op day #3, however was afebrile since then with only a Tmax of 100.1 on day prior to discharge. A urine culture was pending at the time of discharge. Although patient had asymptomatic bacteruria, patient was treated with one dose of Ceftriaxone. Patient was discharged with a 7 day prescription for Keflex.    All of her other medical conditions were stable and no changes were made to her medications.  Issues for Follow Up:  1. She should receive Lovenox for 14 days after her surgery (through 06/04/15) 2. We are treating her for a UTI. Please ensure that she is taking Keflex  for 7 days to complete her antibiotic course, with the first dose on 11/24. She had a urine culture pending at the time of discharge, so please follow-up with this to make sure she was treated appropriately. 3. Ms. Schewe should schedule a follow-up appointment with Dr. Ophelia Charter (orthopedic surgeon) within one week of discharge.  4. Please repeat CBC to monitor hemoglobin levels, since patient had surgery. Hemoglobin level was 8.2 on day of discharge from 9.3 from a day prior. There were no  signs of significant bleeding on exam and patient's vitals were stable.   Significant Procedures:  11/19: ORIF of the right elbow fracture, radial head arthroplasty, and ORIF of the right hip fracture  Significant Labs and Imaging:   Recent Labs Lab 05/21/15 0348 05/22/15 0550 05/23/15 0840  WBC 9.0 10.0 9.0  HGB 8.7* 9.3* 8.2*  HCT 26.6* 28.1* 25.4*  PLT 194 215 253    Recent Labs Lab 05/19/15 1645 05/20/15 0531 05/21/15 0348  NA 139 139 134*  K 3.5 3.6 3.7  CL 109 106 104  CO2 22 26 24   GLUCOSE 115* 129* 127*  BUN 10 13 13   CREATININE 0.63 0.65 0.70  CALCIUM 9.3 8.4* 7.9*   UA: cloudy, few bacteria, small bilirubin, small Hgb, 15 ketones, small leukocytes, 30 protein, TNTC WBC.  Vitamin D: 35.5 (WNL)  Results/Tests Pending at Time of Discharge: Vitamin D level, urine culture.  Discharge Medications:    Medication List    STOP taking these medications        ibuprofen 200 MG tablet  Commonly known as:  ADVIL,MOTRIN      TAKE these medications        alendronate 70 MG tablet  Commonly known as:  FOSAMAX  Take 70 mg by mouth once a week. Take with a full glass of water on an empty stomach. - on Mondays     aspirin EC 81 MG tablet  Take 81 mg by mouth daily.     CALCIUM 600 + D PO  Take 2 tablets by mouth daily.     Carbidopa-Levodopa ER 25-100 MG tablet controlled release  Commonly known as:  SINEMET CR  Take 2 tablets by mouth 3 (three) times daily. Breakfast (8am), 4pm and 10pm     cephALEXin 250 MG capsule  Commonly known as:  KEFLEX  Take 1 capsule (250 mg total) by mouth every 6 (six) hours.  Start taking on:  05/24/2015     enoxaparin 30 MG/0.3ML injection  Commonly known as:  LOVENOX  Inject 0.3 mLs (30 mg total) into the skin every 12 (twelve) hours.     Fish Oil 1000 MG Caps  Take 1,000 mg by mouth 2 (two) times daily with a meal.     fluticasone 50 MCG/ACT nasal spray  Commonly known as:  FLONASE  Place 2 sprays into both nostrils  daily as needed (seasonal allergies).     oxyCODONE-acetaminophen 5-325 MG tablet  Commonly known as:  ROXICET  Take 1 tablet by mouth every 6 (six) hours as needed for severe pain.     polyethylene glycol packet  Commonly known as:  MIRALAX / GLYCOLAX  Take 17 g by mouth daily. Mix in coffee and drink     pramipexole 0.125 MG tablet  Commonly known as:  MIRAPEX  Take 0.125 mg by mouth at bedtime.     pravastatin 40 MG tablet  Commonly known as:  PRAVACHOL  Take 40 mg by mouth at bedtime.     REFRESH OP  Place 1 drop into both eyes 4 (four) times daily as needed (dry eyes).     Vitamin B-12 1000 MCG Subl  Place 1,000 mcg under the tongue daily.        Discharge Instructions: Please refer to Patient Instructions section of EMR for full details.  Patient was counseled important signs and symptoms that should prompt return to medical care, changes in medications, dietary instructions, activity restrictions, and follow up appointments.   Follow-Up Appointments: Follow-up Information    Follow up with Eldred Manges, MD. Schedule an appointment as soon as possible for a visit in 1 week.   Specialty:  Orthopedic Surgery   Why:  need return office visit one week.    Contact information:   [address] [place] [address] [phone]       Please follow up.   Why:  Please follow up with your primary care provider for a hosptial follow up appointment in 1-2 weeks       Palma Holter, MD 05/23/2015, 1:00 PM PGY-1, [facility]

## 2015-05-22 NOTE — Progress Notes (Signed)
Subjective: 3 Days Post-Op Procedure(s) (LRB): OPEN REDUCTION INTERNAL FIXATION (ORIF) ELBOW/OLECRANON FRACTURE (Right) ORIF INTERTROCH HIP FX (Right) RADIAL HEAD ARTHROPLASTY (Right) Patient reports pain as mild.    Objective: Vital signs in last 24 hours: Temp:  [99 F (37.2 C)-101.1 F (38.4 C)] 99 F (37.2 C) (11/22 16100613) Pulse Rate:  [96-106] 96 (11/22 0613) Resp:  [16-18] 18 (11/22 0613) BP: (131-141)/(53-58) 137/53 mmHg (11/22 0613) SpO2:  [97 %-98 %] 97 % (11/22 96040613) Weight:  [77.111 kg (170 lb)] 77.111 kg (170 lb) (11/21 1400)  Intake/Output from previous day: 11/21 0701 - 11/22 0700 In: -  Out: 500 [Urine:500] Intake/Output this shift:     Recent Labs  05/19/15 1645 05/20/15 0531 05/21/15 0348 05/22/15 0550  HGB 12.0 10.6* 8.7* 9.3*    Recent Labs  05/21/15 0348 05/22/15 0550  WBC 9.0 10.0  RBC 3.09* 3.27*  HCT 26.6* 28.1*  PLT 194 215    Recent Labs  05/20/15 0531 05/21/15 0348  NA 139 134*  K 3.6 3.7  CL 106 104  CO2 26 24  BUN 13 13  CREATININE 0.65 0.70  GLUCOSE 129* 127*  CALCIUM 8.4* 7.9*   No results for input(s): LABPT, INR in the last 72 hours.  Neurologically intact  Assessment/Plan: 3 Days Post-Op Procedure(s) (LRB): OPEN REDUCTION INTERNAL FIXATION (ORIF) ELBOW/OLECRANON FRACTURE (Right) ORIF INTERTROCH HIP FX (Right) RADIAL HEAD ARTHROPLASTY (Right) Up with therapy   CIR vs SNF.       She can WB thru forearm with platform walker .   FWB LE.   Ileigh Mettler C 05/22/2015, 7:15 AM

## 2015-05-22 NOTE — Progress Notes (Signed)
Inpatient Rehabilitation  I continue to await response from [facility], however it is unlikely that I will have a bed to offer Mrs. Clelia CroftShaw today.  I have updated Windell MouldingEric Anterhaus, SW and will follow up with the patient.  Please call if questions.  Weldon PickingSusan Collie Wernick PT Inpatient Rehab Admissions Coordinator Cell [phone] Office [phone]

## 2015-05-22 NOTE — Progress Notes (Signed)
Family Medicine Teaching Service Daily Progress Note Intern Pager: [phone]  Patient name: Tina Shah Medical record number: [id number] Date of birth: 03-05-1941 Age: 74 y.o. Gender: female  Primary Care Provider: No primary care provider on file. Consultants: Orthopedic Surgery Code Status: Full  Pt Overview and Major Events to Date:  11/19 - Admitted for right elbow and right hip fx after fall 11/20: Received ORIF of right elbow and right hip   Assessment and Plan: Tina Shah is a 73 y.o. female who presented with right elbow pain/swelling and right hip pain after fall. PMH is significant for osteoporosis, Parkinson's disease, breast cancer s/p right breast mastectomy.  Post-op Fever: Pt had another fever overnight up to 101.1. Possible causes include UTI (although Pt denies dysuria and UA is not consistent with infection), pneumonia (crackles heard in left lung base, although Pt denies shortness of breath/cough and Pt is having good O2 saturations on room air), wound infection (although surgical sites appear normal, without erythema or drainage), DVT (although Pt is on Lovenox and SCDs, has a negative Homan's sign bilaterally, and no tenderness to palpation of her calves). At this point, we think her fever is most likely caused by atelectasis. - Encouraged continued incentive spirometry use. - Will continue to monitor.  Right elbow fracture and right hip fracture: She is Post-op day #3 s/p ORIF on right hip, ORIF on right elbow, and radial head arthroplasty. Pain is well-controlled this morning. - s/p surgery POD #3 - othro following. - Early mobilization. - Pain management with Tylenol  q6hrs prn and Oxycodone 5-10mg  q4hrs prn. - Zofran for nausea - PT/OT recommending CIR. - Pt will go to CIR if her insurance will pay for it; otherwise, SNF in [place]. - Pt received an ECHO per CIR, which showed an EF 60-65%, G1DD, mild aortic stenosis. - Hemoglobin has stabilized after  surgery, so no concern for acute bleed. DVT prophylaxis with Lovenox  q12hrs.   Osteoporosis: Baseline osteoporosis likely contributed to Pt's fractures. - Holding home meds: Alendronate  weekly, calcium + vitamin D.  Parkinson's disease: On exam, Pt has pill-rolling tremor bilaterally. She lives alone and is able to perform her IDLs and IADLs without difficulty. - Continue home med: Pramipexole 0.125mg  qhs and Sinemet 25-100mg  2 tablets tid  Normocytic anemia: Stable Hgb dropped from 12 on admission > 11.0 pre-op > 8.7 > 9.3 this morning. Likely secondary to acute blood loss s/p surgery. - Will continue to monitor.  FEN/GI: Regular diet, SLIV tolerating PO. Bowel regimen: Dulcolax suppository  daily prn, colace  bid, miralax daily. Prophylaxis: Lovenox  q12hrs.  Disposition: CIR vs SNF.    Subjective:  Pt is doing well this morning. She states she is not currently in any pain. She denies any dysuria, shortness of breath, fevers, chills, feeling sick, or calf pain. She has no concerns this morning.  Objective: Temp:  [99 F (37.2 C)-101.1 F (38.4 C)] 99 F (37.2 C) (11/22 4401) Pulse Rate:  [96-106] 96 (11/22 0613) Resp:  [16-18] 18 (11/22 0613) BP: (131-141)/(53-58) 137/53 mmHg (11/22 0613) SpO2:  [97 %-98 %] 97 % (11/22 0613) Weight:  [170 lb (77.111 kg)] 170 lb (77.111 kg) (11/21 1400) Physical Exam: General: Elderly female, sitting up in chair, in NAD, alert, pleasant HEENT: [place]/AT, EOMI, moist mucous membranes Cardiovascular: 3/6 holosystolic murmur heard best over the RUSB, RRR; pulses intact Respiratory: Crackles in left lung base improved from yesterday, otherwise lungs are clear, normal work of breathing. Abdomen: +BS, soft,  non-tender, non-distended MSK: No edema. R arm casted and splinted post-op; Homan's sign negative bilaterally, no tenderness to palpation of calves. Skin: Warm and dry, no rashes Neuro: A&Ox3; non-focal but does have pill-rolling  tremor in upper extremities bilaterally Psych: Appropriate mood and affect.  Laboratory:  Recent Labs Lab 05/20/15 0531 05/21/15 0348 05/22/15 0550  WBC 11.0* 9.0 10.0  HGB 10.6* 8.7* 9.3*  HCT 32.8* 26.6* 28.1*  PLT 252 194 215    Recent Labs Lab 05/19/15 1645 05/20/15 0531 05/21/15 0348  NA 139 139 134*  K 3.5 3.6 3.7  CL 109 106 104  CO2 22 26 24   BUN 10 13 13   CREATININE 0.63 0.65 0.70  CALCIUM 9.3 8.4* 7.9*  GLUCOSE 115* 129* 127*    Imaging/Diagnostic Tests: Dg Chest 1 View 05/19/2015  IMPRESSION: No significant abnormality.   Dg Elbow 2 Views Right 05/19/2015  IMPRESSION: Comminuted proximal ulna fracture. Anatomic distortion secondary to positioning and pain. Suspicion of posterior dislocation of the radial head.   Ct Elbow Right W/o Cm 05/19/2015  IMPRESSION: 1. Comminuted fracture of the olecranon process without articular surface involvement, with posterior displacement of the the major distal fracture fragment. Ulnohumeral alignment is normal. 2. Posterior dislocation of the radial head relative to the capitellum with the posteriorly dislocated radial head located posterior and distal to the capitellum. Fracture of the volar aspect of the radial head with the displaced radial head fracture fragment located along the anterior superior aspect of the capitellum with approximately 17 mm of distraction between the fracture fragment and the remainder of the radial head. 3. Small fracture fragment between the radial head and proximal ulna. 4. Large hematoma in the subcutaneous fat overlying the ulnar aspect of the elbow joint.   Dg Hip Unilat With Pelvis 2-3 Views Right 05/19/2015  IMPRESSION: Comminuted angulated overriding intertrochanteric fracture of the proximal right femur. Osteoarthritis of the right hip joint.    Campbell StallKaty Dodd Amayah Staheli, MD 05/22/2015, 7:03 AM PGY-1, [facility] FPTS Intern pager: [phone], text pages welcome

## 2015-05-22 NOTE — Progress Notes (Signed)
Patient called stating that she had drainage from her arm.  I examined her arm and on the back of the right arm, above the elbow area,there was serosanguinous drainage on the ace wrap and on the pillow.  I paged Dr. Ophelia CharterYates and he requested that I reinforce the dressing.  I also ordered her another sling.  He has requested that she not put any pressure on that arm when trying to stand due to the severity of her injury.  I will continue to monitor the patient.

## 2015-05-22 NOTE — Progress Notes (Signed)
Physical Therapy Treatment Patient Details Name: Tina Shah MRN: [id number] DOB: 6/6Janet Shah/1942 Today's Date: 05/22/2015    History of Present Illness R hip ORIF, R elbow ORIF following a fall getting out of a van. PMhx includes osteoporosis, Parkinsonism, breast cancer, and pituitary tumor     PT Comments    Pt with improved ability to stand and use platform RW today but required assist for balance as well as advancing RLE. Pt with continued difficulty with RLE movement and weight bearing. Encouraged increased mobility and use of [facility] rather than bedpan and continued HEP. Will continue to follow.   Follow Up Recommendations  CIR;Supervision/Assistance - 24 hour     Equipment Recommendations       Recommendations for Other Services       Precautions / Restrictions Precautions Precautions: Fall Required Braces or Orthoses: Sling Restrictions RUE Weight Bearing:  (weight bear on forearm with platform for standing) RLE Weight Bearing: Weight bearing as tolerated Other Position/Activity Restrictions: Dr.Yates cleared platform for standing and gait on RUE    Mobility  Bed Mobility Overal bed mobility: Needs Assistance Bed Mobility: Supine to Sit     Supine to sit: Mod assist;+2 for safety/equipment     General bed mobility comments: cues and assist to bring bril LE to pivot to EOb as well as assist to elevate trunk, sequential cues throughout  Transfers Overall transfer level: Needs assistance   Transfers: Sit to/from Stand;Stand Pivot Transfers Sit to Stand: Mod assist;+2 physical assistance Stand pivot transfers: Mod assist;+2 physical assistance       General transfer comment: cues for hand placement, leg positioning and assist for anterior translation and elevation with right knee blocked to stand. Platform RW used to pivot short pivotal steps to chair with assist to advance RLE and block knee, pt able to step back with RLE on her own. Pt sitting before  reaching for surface despite cues with mod assist to control descent.   Ambulation/Gait                 Stairs            Wheelchair Mobility    Modified Rankin (Stroke Patients Only)       Balance Overall balance assessment: Needs assistance   Sitting balance-Leahy Scale: Good       Standing balance-Leahy Scale: Poor Standing balance comment: cues for posture, increased weight bearing on RLE and assist to maintain balance                    Cognition Arousal/Alertness: Awake/alert Behavior During Therapy: WFL for tasks assessed/performed Overall Cognitive Status: Within Functional Limits for tasks assessed                      Exercises General Exercises - Lower Extremity Quad Sets: AROM;Seated;Right;10 reps Long Arc Quad: AROM;Seated;Right;10 reps Hip ABduction/ADduction: AAROM;Seated;Right;10 reps Straight Leg Raises: AAROM;Seated;Right;10 reps Hip Flexion/Marching: AAROM;Seated;Right;10 reps    General Comments        Pertinent Vitals/Pain Pain Assessment: No/denies pain    Home Living                      Prior Function            PT Goals (current goals can now be found in the care plan section) Progress towards PT goals: Progressing toward goals    Frequency       PT Plan Current plan  remains appropriate    Co-evaluation             End of Session Equipment Utilized During Treatment: Gait belt Activity Tolerance: Patient tolerated treatment well Patient left: in chair;with call bell/phone within reach     Time: 1106-1128 PT Time Calculation (min) (ACUTE ONLY): 22 min  Charges:  $Therapeutic Activity: 8-22 mins                    G Codes:      Delorse Lek 06-08-2015, 12:13 PM Delaney Meigs, PT [phone]

## 2015-05-22 NOTE — Care Management Important Message (Signed)
Important Message  Patient Details  Name: Tina BerlinJoann P Keizer MRN: [id number] Date of Birth: 05/31/1941   Medicare Important Message Given:  Yes    Yoniel Arkwright P Lundyn Coste 05/22/2015, 3:03 PM

## 2015-05-22 NOTE — Discharge Summary (Signed)
Patient ID: Tina Shah MRN: [id number] DOB/AGE: Oct 04, 1940 74 y.o.  Admit date: 05/19/2015 Discharge date: 05/22/2015  Admission Diagnoses:  Active Problems:   Hip fracture (HCC)   Intertrochanteric fracture of right hip (HCC)   Fall   Fever, unspecified   Discharge Diagnoses:  Active Problems:   Hip fracture (HCC)   Intertrochanteric fracture of right hip (HCC)   Fall   Fever, unspecified  status post Procedure(s): OPEN REDUCTION INTERNAL FIXATION (ORIF) ELBOW/OLECRANON FRACTURE ORIF INTERTROCH HIP FX RADIAL HEAD ARTHROPLASTY  Past Medical History  Diagnosis Date  . Parkinson disease (HCC)   . History of breast cancer     Surgeries: Procedure(s): OPEN REDUCTION INTERNAL FIXATION (ORIF) ELBOW/OLECRANON FRACTURE ORIF INTERTROCH HIP FX RADIAL HEAD ARTHROPLASTY on 05/19/2015 - 05/20/2015   Consultants: Treatment Team:  Eldred Manges, MD  Discharged Condition: Improved  Hospital Course: Tina Shah is an 74 y.o. female who was admitted 05/19/2015 for operative treatment of right olecranon/radial head fracture and right hip IT fracture. Patient failed conservative treatments (please see the history and physical for the specifics) and had severe unremitting pain that affects sleep, daily activities and work/hobbies. After pre-op clearance, the patient was taken to the operating room on 05/19/2015 - 05/20/2015 and underwent  Procedure(s): OPEN REDUCTION INTERNAL FIXATION (ORIF) ELBOW/OLECRANON FRACTURE ORIF INTERTROCH HIP FX RADIAL HEAD ARTHROPLASTY.    Patient was given perioperative antibiotics: Anti-infectives    Start     Dose/Rate Route Frequency Ordered Stop   05/22/15 1100  cefTRIAXone (ROCEPHIN) 1 g in dextrose 5 % 50 mL IVPB     1 g 100 mL/hr over 30 Minutes Intravenous Every 24 hours 05/22/15 1043         Patient was given sequential compression devices and early ambulation to prevent DVT.   Patient benefited maximally from hospital stay and there  were no complications. At the time of discharge, the patient was urinating/moving their bowels without difficulty, tolerating a regular diet, pain is controlled with oral pain medications and they have been cleared by PT/OT.   Recent vital signs: Patient Vitals for the past 24 hrs:  BP Temp Temp src Pulse Resp SpO2 Height Weight  05/22/15 0613 (!) 137/53 mmHg 99 F (37.2 C) Oral 96 18 97 % - -  05/21/15 2126 (!) 131/55 mmHg (!) 101.1 F (38.4 C) Oral (!) 105 16 - - -  05/21/15 1400 (!) 141/58 mmHg 99.2 F (37.3 C) - (!) 106 16 98 % 5\' 3"  (1.6 m) 77.111 kg (170 lb)     Recent laboratory studies:  Recent Labs  05/20/15 0531 05/21/15 0348 05/22/15 0550  WBC 11.0* 9.0 10.0  HGB 10.6* 8.7* 9.3*  HCT 32.8* 26.6* 28.1*  PLT 252 194 215  NA 139 134*  --   K 3.6 3.7  --   CL 106 104  --   CO2 26 24  --   BUN 13 13  --   CREATININE 0.65 0.70  --   GLUCOSE 129* 127*  --   CALCIUM 8.4* 7.9*  --      Discharge Medications:     Medication List    STOP taking these medications        ibuprofen 200 MG tablet  Commonly known as:  ADVIL,MOTRIN      TAKE these medications        alendronate 70 MG tablet  Commonly known as:  FOSAMAX  Take 70 mg by mouth once a week. Take with  a full glass of water on an empty stomach. - on Mondays     aspirin EC 325 MG tablet  Take 1 tablet (325 mg total) by mouth daily.     CALCIUM 600 + D PO  Take 2 tablets by mouth daily.     Carbidopa-Levodopa ER 25-100 MG tablet controlled release  Commonly known as:  SINEMET CR  Take 2 tablets by mouth 3 (three) times daily. Breakfast (8am), 4pm and 10pm     Fish Oil 1000 MG Caps  Take 1,000 mg by mouth 2 (two) times daily with a meal.     fluticasone 50 MCG/ACT nasal spray  Commonly known as:  FLONASE  Place 2 sprays into both nostrils daily as needed (seasonal allergies).     oxyCODONE-acetaminophen 5-325 MG tablet  Commonly known as:  ROXICET  Take 1 tablet by mouth every 6 (six) hours as  needed for severe pain.     polyethylene glycol packet  Commonly known as:  MIRALAX / GLYCOLAX  Take 17 g by mouth daily. Mix in coffee and drink     pramipexole 0.125 MG tablet  Commonly known as:  MIRAPEX  Take 0.125 mg by mouth at bedtime.     pravastatin 40 MG tablet  Commonly known as:  PRAVACHOL  Take 40 mg by mouth at bedtime.     REFRESH OP  Place 1 drop into both eyes 4 (four) times daily as needed (dry eyes).     Vitamin B-12 1000 MCG Subl  Place 1,000 mcg under the tongue daily.        Diagnostic Studies: Dg Chest 1 View  05/19/2015  CLINICAL DATA:  Pre operative respiratory exam.  Right hip fracture. EXAM: CHEST 1 VIEW COMPARISON:  None. FINDINGS: Heart size and pulmonary vascularity are normal. Previous right mastectomy. 3 mm density overlying the anterior aspect of the right third rib is probably a granuloma. Lungs are otherwise clear. No acute osseous abnormality. IMPRESSION: No significant abnormality. Electronically Signed   By: Francene Boyers M.D.   On: 05/19/2015 16:01   Dg Elbow 2 Views Right  05/19/2015  CLINICAL DATA:  Fall.  Pain.  Initial encounter. EXAM: RIGHT ELBOW - 2 VIEW COMPARISON:  None. FINDINGS: Anatomic distortion, secondary to patient immobility and pain. Comminuted fracture of the proximal ulna. Suspect posterior dislocation of the radial head relative to the capitellum. Diffuse soft tissue swelling. IMPRESSION: Comminuted proximal ulna fracture. Anatomic distortion secondary to positioning and pain. Suspicion of posterior dislocation of the radial head. CT versus more complete radiographs should be considered for further characterization. Electronically Signed   By: Jeronimo Greaves M.D.   On: 05/19/2015 16:02   Dg Elbow Complete Right  05/20/2015  CLINICAL DATA:  Open reduction internal fixation right olecranon fracture and radial head arthroplasty. EXAM: RIGHT ELBOW - COMPLETE 3+ VIEW; DG C-ARM GT 120 MIN COMPARISON:  CT earlier this day. FINDINGS:  Eleven fluoroscopic spot images from the operating room provided during ORIF of right elbow fracture. There is plate and multi screw fixation of the comminuted olecranon fracture. A radial head arthroplasty is performed place. Final images demonstrates normal alignment. Fluoroscopic spot images during ORIF of right femur fracture are also included. Total fluoroscopy time 1 minutes 42 seconds. IMPRESSION: Intraoperative fluoroscopy during ORIF right elbow fracture with radial head arthroplasty and plate and screw fixation of olecranon fracture. Electronically Signed   By: Rubye Oaks M.D.   On: 05/20/2015 00:47   Ct Elbow Right W/o Cm  05/19/2015  CLINICAL DATA:  Fracture of the radius and olecranon. Status post fall. EXAM: CT OF THE RIGHT ELBOW WITHOUT CONTRAST TECHNIQUE: Multidetector CT imaging was performed according to the standard protocol. Multiplanar CT image reconstructions were also generated. COMPARISON:  None. FINDINGS: There is a comminuted fracture of the olecranon process without articular surface involvement, with posterior displacement of the the major distal fracture fragment. The ulnohumeral alignment is normal. There is posterior dislocation of the radial head relative to the capitellum. The posteriorly dislocated radial head is located posterior and distal to the capitellum. There is a fracture of the volar aspect of the radial head with the displaced radial head fracture fragment located along the anterior superior aspect of the capitellum with approximately 17 mm of distraction between the fracture fragment and the remainder of the radial head. There is a small fracture fragment between the radial head and proximal ulna. The muscles are normal. There is hyperdense material in along the ulnar aspect of the elbow joint within the subcutaneous fat consistent with a large hematoma measuring approximately 2.9 x 5.2 x 9.4 cm. IMPRESSION: 1. Comminuted fracture of the olecranon process  without articular surface involvement, with posterior displacement of the the major distal fracture fragment. Ulnohumeral alignment is normal. 2. Posterior dislocation of the radial head relative to the capitellum with the posteriorly dislocated radial head located posterior and distal to the capitellum. Fracture of the volar aspect of the radial head with the displaced radial head fracture fragment located along the anterior superior aspect of the capitellum with approximately 17 mm of distraction between the fracture fragment and the remainder of the radial head. 3. Small fracture fragment between the radial head and proximal ulna. 4. Large hematoma in the subcutaneous fat overlying the ulnar aspect of the elbow joint. Electronically Signed   By: Elige Ko   On: 05/19/2015 19:41   Dg C-arm Gt 120 Min  05/20/2015  CLINICAL DATA:  Open reduction internal fixation right olecranon fracture and radial head arthroplasty. EXAM: RIGHT ELBOW - COMPLETE 3+ VIEW; DG C-ARM GT 120 MIN COMPARISON:  CT earlier this day. FINDINGS: Eleven fluoroscopic spot images from the operating room provided during ORIF of right elbow fracture. There is plate and multi screw fixation of the comminuted olecranon fracture. A radial head arthroplasty is performed place. Final images demonstrates normal alignment. Fluoroscopic spot images during ORIF of right femur fracture are also included. Total fluoroscopy time 1 minutes 42 seconds. IMPRESSION: Intraoperative fluoroscopy during ORIF right elbow fracture with radial head arthroplasty and plate and screw fixation of olecranon fracture. Electronically Signed   By: Rubye Oaks M.D.   On: 05/20/2015 00:47   Dg Hip Unilat With Pelvis 2-3 Views Right  05/19/2015  CLINICAL DATA:  Right hip pain after the patient fell exiting her car today. EXAM: DG HIP (WITH OR WITHOUT PELVIS) 2-3V RIGHT COMPARISON:  None. FINDINGS: There is a comminuted angulated over riding intertrochanteric fracture  of the proximal right femur. The lesser trochanter is avulsed. The superior aspect of the greater trochanter is comminuted. Marginal osteophytes on the right femoral head and right acetabulum. Bilateral calcifications in the origins of the hamstring tendons. IMPRESSION: Comminuted angulated overriding intertrochanteric fracture of the proximal right femur. Osteoarthritis of the right hip joint. Electronically Signed   By: Francene Boyers M.D.   On: 05/19/2015 15:59   Dg Femur, Min 2 Views Right  05/20/2015  CLINICAL DATA:  ORIF intertrochanteric right hip fracture. EXAM: RIGHT FEMUR 2 VIEWS COMPARISON:  Radiographs earlier this date. FINDINGS: Five fluoroscopic spot images in the operating room during ORIF right hip fracture demonstrate intra medullary rod in the femoral shaft with proximal and distal locking screws. Total fluoroscopy time 1 minutes 18 seconds. Fluoroscopy during right elbow ORIF also included. IMPRESSION: Intraoperative fluoroscopy during right hip ORIF for intertrochanteric fracture. Electronically Signed   By: Rubye OaksMelanie  Ehinger M.D.   On: 05/20/2015 00:48          Follow-up Information    Schedule an appointment as soon as possible for a visit with Eldred MangesYATES,MARK C, MD.   Specialty:  Orthopedic Surgery   Why:  need return office visit one week.    Contact information:   [address] [place] [address] [phone]       Discharge Plan:  discharge to [facility]   Disposition:     Signed: Naida SleightWENS,JAMES M  For mark yates MD [facility] 05/22/2015, 1:56 PM

## 2015-05-22 NOTE — NC FL2 (Signed)
[place] MEDICAID FL2 LEVEL OF CARE SCREENING TOOL     IDENTIFICATION  Patient Name: Tina Shah Birthdate: 08-11-1940 Sex: female Admission Date (Current Location): 05/19/2015  [facility] and [place] Number: [employer] and Address:  [facility] [place], [address], [place], [place] [address]      Provider Number: [id number]  Attending Physician Name and Address:  Eldred Manges, MD  Relative Name and Phone Number:  DONETA BAYMAN [phone]    Current Level of Care: Hospital Recommended Level of Care: Skilled Nursing Facility Prior Approval Number:    Date Approved/Denied:   PASRR Number: [id number]  Discharge Plan: SNF    Current Diagnoses: Patient Active Problem List   Diagnosis Date Noted  . Fever, unspecified   . Intertrochanteric fracture of right hip (HCC) 05/20/2015  . Fall   . Hip fracture (HCC) 05/19/2015    Orientation ACTIVITIES/SOCIAL BLADDER RESPIRATION    Self, Time, Situation, Place    Continent Normal  BEHAVIORAL SYMPTOMS/MOOD NEUROLOGICAL BOWEL NUTRITION STATUS      Continent    PHYSICIAN VISITS COMMUNICATION OF NEEDS Height & Weight Skin    Verbally   170 lbs. Surgical wounds          AMBULATORY STATUS RESPIRATION    Supervision limited Normal      Personal Care Assistance Level of Assistance  Dressing, Bathing Bathing Assistance: Limited assistance   Dressing Assistance: Limited assistance      Functional Limitations Info                SPECIAL CARE FACTORS FREQUENCY  PT (By licensed PT)     PT Frequency: 5X             Additional Factors Info                  Current Medications (05/22/2015): Current Facility-Administered Medications  Medication Dose Route Frequency Provider Last Rate Last Dose  . acetaminophen (TYLENOL) tablet 650 mg  650 mg Oral Q6H PRN Eldred Manges, MD   650 mg at 05/21/15 0531   Or  . acetaminophen (TYLENOL) suppository 650 mg  650 mg Rectal Q6H PRN Eldred Manges, MD      . aspirin EC tablet 325 mg  325 mg Oral Q breakfast Eldred Manges, MD   325 mg at 05/22/15 0913  . bisacodyl (DULCOLAX) suppository 10 mg  10 mg Rectal Daily PRN Eldred Manges, MD      . Carbidopa-Levodopa ER (SINEMET CR) 25-100 MG tablet controlled release 2 tablet  2 tablet Oral 3 times per day Campbell Stall, MD   2 tablet at 05/22/15 0955  . cefTRIAXone (ROCEPHIN) 1 g in dextrose 5 % 50 mL IVPB  1 g Intravenous Q24H Casey Burkitt, MD      . docusate sodium (COLACE) capsule 100 mg  100 mg Oral BID Eldred Manges, MD   100 mg at 05/22/15 0913  . enoxaparin (LOVENOX) injection 30 mg  30 mg Subcutaneous Q12H Ann Held, RPH   30 mg at 05/22/15 4010  . fluticasone (FLONASE) 50 MCG/ACT nasal spray 2 spray  2 spray Each Nare Daily PRN Campbell Stall, MD      . menthol-cetylpyridinium (CEPACOL) lozenge 3 mg  1 lozenge Oral PRN Eldred Manges, MD       Or  . phenol (CHLORASEPTIC) mouth spray 1 spray  1 spray Mouth/Throat PRN Eldred Manges, MD      .  metoCLOPramide (REGLAN) tablet 5-10 mg  5-10 mg Oral Q8H PRN Eldred MangesMark C Yates, MD       Or  . metoCLOPramide (REGLAN) injection 5-10 mg  5-10 mg Intravenous Q8H PRN Eldred MangesMark C Yates, MD      . ondansetron [facility](ZOFRAN) tablet 4 mg  4 mg Oral Q6H PRN Campbell StallKaty Dodd Mayo, MD       Or  . ondansetron [facility](ZOFRAN) injection 4 mg  4 mg Intravenous Q6H PRN Campbell StallKaty Dodd Mayo, MD      . oxyCODONE (Oxy IR/ROXICODONE) immediate release tablet 5-10 mg  5-10 mg Oral Q4H PRN Eldred MangesMark C Yates, MD   10 mg at 05/22/15 0606  . polyethylene glycol (MIRALAX / GLYCOLAX) packet 17 g  17 g Oral Daily Campbell StallKaty Dodd Mayo, MD   17 g at 05/22/15 0913  . polyvinyl alcohol (LIQUIFILM TEARS) 1.4 % ophthalmic solution 1 drop  1 drop Both Eyes TID Eldred MangesMark C Yates, MD   1 drop at 05/22/15 0914  . pramipexole (MIRAPEX) tablet 0.125 mg  0.125 mg Oral QHS Campbell StallKaty Dodd Mayo, MD   0.125 mg at 05/21/15 2051  . pravastatin (PRAVACHOL) tablet 40 mg  40 mg Oral QHS Campbell StallKaty Dodd Mayo, MD   40 mg at 05/21/15 2051   . vitamin B-12 (CYANOCOBALAMIN) tablet 1,000 mcg  1,000 mcg Oral Daily Eldred MangesMark C Yates, MD   1,000 mcg at 05/22/15 [id number]   Do not use this list as official medication orders. Please verify with discharge summary.  Discharge Medications:   Medication List    ASK your doctor about these medications        alendronate 70 MG tablet  Commonly known as:  FOSAMAX  Take 70 mg by mouth once a week. Take with a full glass of water on an empty stomach. - on Mondays     aspirin EC 81 MG tablet  Take 81 mg by mouth daily.     CALCIUM 600 + D PO  Take 2 tablets by mouth daily.     Carbidopa-Levodopa ER 25-100 MG tablet controlled release  Commonly known as:  SINEMET CR  Take 2 tablets by mouth 3 (three) times daily. Breakfast (8am), 4pm and 10pm     Fish Oil 1000 MG Caps  Take 1,000 mg by mouth 2 (two) times daily with a meal.     fluticasone 50 MCG/ACT nasal spray  Commonly known as:  FLONASE  Place 2 sprays into both nostrils daily as needed (seasonal allergies).     ibuprofen 200 MG tablet  Commonly known as:  ADVIL,MOTRIN  Take 400-600 mg by mouth at bedtime as needed (pain).     polyethylene glycol packet  Commonly known as:  MIRALAX / GLYCOLAX  Take 17 g by mouth daily. Mix in coffee and drink     pramipexole 0.125 MG tablet  Commonly known as:  MIRAPEX  Take 0.125 mg by mouth at bedtime.     pravastatin 40 MG tablet  Commonly known as:  PRAVACHOL  Take 40 mg by mouth at bedtime.     REFRESH OP  Place 1 drop into both eyes 4 (four) times daily as needed (dry eyes).     Vitamin B-12 1000 MCG Subl  Place 1,000 mcg under the tongue daily.        Relevant Imaging Results:  Relevant Lab Results:  Recent Labs    Additional Information    Geraldyne Barraclough, Ervin Knackric R, LCSWA

## 2015-05-22 NOTE — Progress Notes (Addendum)
Inpatient Rehabilitation  I unfortunately do not have a bed to offer Mrs. Tina Shah. I have spoken with pt. And she understands, stating that she will be going to [facility].   I have updated  Tina Shah, SW, and have withdrawn my request for approval for CIR from [facility].  I will sign off.    Weldon PickingSusan Amed Datta PT Inpatient Rehab Admissions Coordinator Cell [phone] Office [phone]

## 2015-05-22 NOTE — Discharge Instructions (Addendum)
- Your were hospitalized after your fall.  - You were also found to have a urinary tract infection. Although you do not have any symptoms, we recommend to complete an antibiotic course for 7 days. You will start this antibiotic on 11/24.  - You will also be on Lovenox for a total of 14 days. This is a medication (injection) that will decrease the risk of blood clots forming in your legs especially after your surgery.  - The skilled nursing facility will monitor your medications while you are there.   INSTRUCTIONS AFTER JOINT REPLACEMENT   o Remove items at home which could result in a fall. This includes throw rugs or furniture in walking pathways o ICE to the affected joint every three hours while awake for 30 minutes at a time, for at least the first 3-5 days, and then as needed for pain and swelling.  Continue to use ice for pain and swelling. You may notice swelling that will progress down to the foot and ankle.  This is normal after surgery.  Elevate your leg when you are not up walking on it.   o Continue to use the breathing machine you got in the hospital (incentive spirometer) which will help keep your temperature down.  It is common for your temperature to cycle up and down following surgery, especially at night when you are not up moving around and exerting yourself.  The breathing machine keeps your lungs expanded and your temperature down.   DIET:  As you were doing prior to hospitalization, we recommend a well-balanced diet.  DRESSING / WOUND CARE / SHOWERING  You may change your dressing 3-5 days after surgery.  Then change the dressing every day with sterile gauze.  Please use good hand washing techniques before changing the dressing.  Do not use any lotions or creams on the incision until instructed by your surgeon.  ACTIVITY  o Increase activity slowly as tolerated, but follow the weight bearing instructions below.   o No lifting or carrying. until further directed by your  surgeon. o Avoid periods of inactivity such as sitting longer than an hour when not asleep. This helps prevent blood clots.   WEIGHT BEARING   Weight bearing as tolerated with assist device (walker, cane, etc) as directed, use it as long as suggested by your surgeon or therapist, typically at least 4-6 weeks.  Ok to use platform walker per Dr Annell GreeningMark Yates.    EXERCISES Rehab per protocol after ORIF hip.   Elbow must remain in splint.  Do not remove.  Will not start rehab for the elbow until further notice.  CONSTIPATION  Constipation is defined medically as fewer than three stools per week and severe constipation as less than one stool per week.  Even if you have a regular bowel pattern at home, your normal regimen is likely to be disrupted due to multiple reasons following surgery.  Combination of anesthesia, postoperative narcotics, change in appetite and fluid intake all can affect your bowels.   YOU MUST use at least one of the following options; they are listed in order of increasing strength to get the job done.  They are all available over the counter, and you may need to use some, POSSIBLY even all of these options:    Drink plenty of fluids (prune juice may be helpful) and high fiber foods Colace 100 mg by mouth twice a day  Senokot for constipation as directed and as needed Dulcolax (bisacodyl), take with full  glass of water  Miralax (polyethylene glycol) once or twice a day as needed.  If you have tried all these things and are unable to have a bowel movement in the first 3-4 days after surgery call either your surgeon or your primary doctor.    If you experience loose stools or diarrhea, hold the medications until you stool forms back up.  If your symptoms do not get better within 1 week or if they get worse, check with your doctor.  If you experience "the worst abdominal pain ever" or develop nausea or vomiting, please contact the office immediately for further recommendations for  treatment.   ITCHING:  If you experience itching with your medications, try taking only a single pain pill, or even half a pain pill at a time.  You can also use Benadryl over the counter for itching or also to help with sleep.   TED HOSE STOCKINGS:  Use stockings on both legs until for at least 2 weeks or as directed by physician office. They may be removed at night for sleeping.  MEDICATIONS:  See your medication summary on the After Visit Summary that nursing will review with you.  You may have some home medications which will be placed on hold until you complete the course of blood thinner medication.  It is important for you to complete the blood thinner medication as prescribed.  PRECAUTIONS:  If you experience chest pain or shortness of breath - call 911 immediately for transfer to the hospital emergency department.   If you develop a fever greater that 101 F, purulent drainage from wound, increased redness or drainage from wound, foul odor from the wound/dressing, or calf pain - CONTACT YOUR SURGEON.                                                   FOLLOW-UP APPOINTMENTS:  If you do not already have a post-op appointment, please call the office for an appointment to be seen by your surgeon.  Guidelines for how soon to be seen are listed in your After Visit Summary, but are typically between 1-4 weeks after surgery.  MAKE SURE YOU:   Understand these instructions.   Get help right away if you are not doing well or get worse.    Thank you for letting us be a part of your medical care team.  It is a privilege we respect greatly.  We hope these instructions will help you stay on track for a fast and full recovery!

## 2015-05-22 NOTE — Progress Notes (Signed)
Orthopedic Tech Progress Note Patient Details:  Tina BerlinJoann P Shah 11/06/1940 [id number]  Ortho Devices Type of Ortho Device: Arm sling Ortho Device/Splint Location: RUE Ortho Device/Splint Interventions: Ordered, Application   Jennye MoccasinHughes, Tina Shah Tina Shah, 4:48 PM

## 2015-05-23 LAB — CBC
HEMATOCRIT: 25.4 % — AB (ref 36.0–46.0)
HEMOGLOBIN: 8.2 g/dL — AB (ref 12.0–15.0)
MCH: 27.6 pg (ref 26.0–34.0)
MCHC: 32.3 g/dL (ref 30.0–36.0)
MCV: 85.5 fL (ref 78.0–100.0)
Platelets: 253 10*3/uL (ref 150–400)
RBC: 2.97 MIL/uL — AB (ref 3.87–5.11)
RDW: 13.7 % (ref 11.5–15.5)
WBC: 9 10*3/uL (ref 4.0–10.5)

## 2015-05-23 LAB — URINE CULTURE

## 2015-05-23 LAB — VITAMIN D 25 HYDROXY (VIT D DEFICIENCY, FRACTURES): VIT D 25 HYDROXY: 35.5 ng/mL (ref 30.0–100.0)

## 2015-05-23 MED ORDER — CEPHALEXIN 250 MG PO CAPS: 250.0000 mg | ORAL_CAPSULE | Freq: Four times a day (QID) | ORAL | Status: DC

## 2015-05-23 MED ORDER — CEPHALEXIN 250 MG PO CAPS
250.0000 mg | ORAL_CAPSULE | Freq: Four times a day (QID) | ORAL | Status: DC
  Filled 2015-05-23 (×4): qty 1

## 2015-05-23 MED ORDER — ENOXAPARIN SODIUM 30 MG/0.3ML ~~LOC~~ SOLN: 30.0000 mg | Freq: Two times a day (BID) | SUBCUTANEOUS | Status: DC

## 2015-05-23 NOTE — Clinical Social Work Placement (Signed)
   CLINICAL SOCIAL WORK PLACEMENT  NOTE  Date:  05/23/2015  Patient Details  Name: Tina Shah MRN: [id number] Date of Birth: [birth year]  Clinical Social Work is seeking post-discharge placement for this patient at the [place] level of care (*CSW will initial, date and re-position this form in  chart as items are completed):  Yes (presented by unit CSW)   Patient/family provided with [facility] Work Department's list of facilities offering this level of care within the geographic area requested by the patient (or if unable, by the patient's family).      Patient/family informed of their freedom to choose among providers that offer the needed level of care, that participate in Medicare, Medicaid or managed care program needed by the patient, have an available bed and are willing to accept the patient.      Patient/family informed of [facility]'s ownership interest in [facility] and [facility], as well as of the fact that they are under no obligation to receive care at these facilities.  PASRR submitted to EDS on 05/21/15     PASRR number received on 05/21/15     Existing PASRR number confirmed on       FL2 transmitted to all facilities in geographic area requested by pt/family on 05/22/15     FL2 transmitted to all facilities within larger geographic area on       Patient informed that his/her managed care company has contracts with or will negotiate with certain facilities, including the following:   ([employer] Medicare (not [employer]))     Yes   Patient/family informed of bed offers received.  Patient chooses bed at [place]     Physician recommends and patient chooses bed at      Patient to be transferred to [place] on 05/23/15.  Patient to be transferred to facility by [facility])     Patient family notified on 05/23/15 of transfer.  Name of family member notified:  Patient  (she will notify her  sons)     PHYSICIAN Please sign FL2, Please prepare prescriptions, Please prepare priority discharge summary, including medications     Additional Comment: Ok per MD for d/c today to SNF.  CSW received call from [facility] [place] that [facility] has been received for patient and they can accept patient today. CSW met with patient this afternoon; her daughter-in-law and a grandson were in the room. Patient noted to be sitting up in a chair and stated that she was very pleased to be able to leave the hospital today. Patient wants to enjoy Thanksgiving with her family tomorrow.  EMS transport arranged and DC summary sent to the facility.  Nursing notified to call report. Patient stated that both of her sons would be coming to the hospital prior to her leaving the hospital.  No further needs identified;  CSW signing off.     _______________________________________________ Williemae Area, LCSW 05/23/2015, 3:30 PM

## 2015-05-23 NOTE — Progress Notes (Signed)
Subjective: 4 Days Post-Op Procedure(s) (LRB): OPEN REDUCTION INTERNAL FIXATION (ORIF) ELBOW/OLECRANON FRACTURE (Right) ORIF INTERTROCH HIP FX (Right) RADIAL HEAD ARTHROPLASTY (Right) Patient reports pain as mild.    Objective: Vital signs in last 24 hours: Temp:  [98.9 F (37.2 C)-100.1 F (37.8 C)] 98.9 F (37.2 C) (11/23 0451) Pulse Rate:  [98-106] 98 (11/23 0451) Resp:  [17-18] 17 (11/23 0451) BP: (133-137)/(51-63) 135/53 mmHg (11/23 0451) SpO2:  [98 %-100 %] 98 % (11/23 0451)  Intake/Output from previous day: 11/22 0701 - 11/23 0700 In: 450 [P.O.:450] Out: -  Intake/Output this shift:     Recent Labs  05/21/15 0348 05/22/15 0550  HGB 8.7* 9.3*    Recent Labs  05/21/15 0348 05/22/15 0550  WBC 9.0 10.0  RBC 3.09* 3.27*  HCT 26.6* 28.1*  PLT 194 215     Recent Labs  05/21/15 0348  NA 134*  K 3.7  CL 104  CO2 24  BUN 13  CREATININE 0.70  GLUCOSE 127*  CALCIUM 7.9*   No results for input(s): LABPT, INR in the last 72 hours.  Neurologically intact  Assessment/Plan: 4 Days Post-Op Procedure(s) (LRB): OPEN REDUCTION INTERNAL FIXATION (ORIF) ELBOW/OLECRANON FRACTURE (Right) ORIF INTERTROCH HIP FX (Right) RADIAL HEAD ARTHROPLASTY (Right) Discharge to SNF,     Will be NWB on right UE,  Pivot to recliner . Office next week for LAC application and dressing change  YATES,MARK C 05/23/2015, 7:36 AM

## 2015-05-23 NOTE — Progress Notes (Signed)
Report called to [facility] at [facility] [address]

## 2015-05-23 NOTE — Progress Notes (Signed)
Family Medicine Teaching Service Daily Progress Note Intern Pager: [phone]  Patient name: Tina Shah Medical record number: [id number] Date of birth: [birth year] Age: 74 y.o. Gender: female  Primary Care Provider: No primary care provider on file. Consultants: Orthopedic Surgery Code Status: Full  Pt Overview and Major Events to Date:  11/19 - Admitted for right elbow and right hip fx after fall 11/20: Received ORIF of right elbow and right hip   Assessment and Plan: Tina Shah is a 74 y.o. female who presented with right elbow pain/swelling and right hip pain after fall. PMH is significant for osteoporosis, Parkinson's disease, breast cancer s/p right breast mastectomy.  Post-op Fever: Tmax of 100.1 overnight.  Possible causes include UTI (although Pt denies dysuria and UA is not consistent with infection), pneumonia (crackles heard in left lung base, although Pt denies shortness of breath/cough and Pt is having good O2 saturations on room air), wound infection (although surgical sites appear normal, without erythema or drainage), DVT (although Pt is on Lovenox and SCDs, has a negative Homan's sign bilaterally, and no tenderness to palpation of her calves). At this point, we think her fever is most likely caused by atelectasis. - Encouraged continued incentive spirometry use. - Will continue to monitor.  Right elbow fracture and right hip fracture: She is Post-op day #4 s/p ORIF on right hip, ORIF on right elbow, and radial head arthroplasty. Pain is well-controlled this morning. - s/p surgery POD #3 - othro following. - Early mobilization. - Pain management with Tylenol  q6hrs prn and Oxycodone 5-10mg  q4hrs prn. - Zofran for nausea - PT/OT recommending CIR. - CIR does not have bed to offer patient; will be going to [facility] - Pt received an ECHO per CIR, which showed an EF 60-65%, G1DD, mild aortic stenosis. - Hemoglobin has stabilized after surgery, so no concern for acute  bleed. DVT prophylaxis with Lovenox  q12hrs.   Osteoporosis: Baseline osteoporosis likely contributed to Pt's fractures. - Holding home meds: Alendronate  weekly, calcium + vitamin D. - Vitamin D level 35.5  Parkinson's disease: On exam, Pt has pill-rolling tremor bilaterally. She lives alone and is able to perform her IDLs and IADLs without difficulty. - Continue home med: Pramipexole 0.125mg  qhs and Sinemet 25-100mg  2 tablets tid  Normocytic anemia: Stable Hgb dropped from 12 on admission > 11.0 pre-op > 8.7 > 9.3 > 8.2 this morning. Likely secondary to acute blood loss s/p surgery. - Will continue to monitor.  FEN/GI: Regular diet, SLIV tolerating PO. Bowel regimen: Dulcolax suppository  daily prn, colace  bid, miralax daily. Prophylaxis: Lovenox  q12hrs.  Disposition: CIR vs SNF.    Subjective:  - per chart review, overnight noted to have serosanguinous drainage on ace wrap from right arm. Dr. Ophelia Charter was pages who recommended reinforce dressing and ordered another sling. Request not to put pressure on right arm.  - patient doing well, no concerns, pain well controlled.  - denies fevers, chills, abdominal pain, dysuria, increased urinary frequency   Objective: Temp:  [98.9 F (37.2 C)-100.1 F (37.8 C)] 98.9 F (37.2 C) (11/23 0451) Pulse Rate:  [98-106] 98 (11/23 0451) Resp:  [17-18] 17 (11/23 0451) BP: (133-137)/(51-63) 135/53 mmHg (11/23 0451) SpO2:  [98 %-100 %] 98 % (11/23 0451) Physical Exam: General: Elderly female, sitting up in chair, in NAD, alert, pleasant HEENT: [place]/AT, EOMI, moist mucous membranes Cardiovascular: 3/6 holosystolic murmur heard best over the RUSB, RRR; pulses intact Respiratory: Crackles in left lung base improved  from yesterday, otherwise lungs are clear, normal work of breathing. Abdomen: +BS, soft, non-tender, non-distended MSK: No edema. R arm casted and splinted post-op; Homan's sign negative bilaterally, no tenderness to  palpation of calves. Right LE: incisions are intact with minimal oozing. Minimal tenderness of thigh.  Skin: Warm and dry, no rashes Neuro: A&Ox3; non-focal but does have pill-rolling tremor in upper extremities bilaterally Psych: Appropriate mood and affect.  Laboratory:  Recent Labs Lab 05/20/15 0531 05/21/15 0348 05/22/15 0550  WBC 11.0* 9.0 10.0  HGB 10.6* 8.7* 9.3*  HCT 32.8* 26.6* 28.1*  PLT 252 194 215    Recent Labs Lab 05/19/15 1645 05/20/15 0531 05/21/15 0348  NA 139 139 134*  K 3.5 3.6 3.7  CL 109 106 104  CO2 22 26 24   BUN 10 13 13   CREATININE 0.63 0.65 0.70  CALCIUM 9.3 8.4* 7.9*  GLUCOSE 115* 129* 127*    Imaging/Diagnostic Tests: Dg Chest 1 View 05/19/2015  IMPRESSION: No significant abnormality.   Dg Elbow 2 Views Right 05/19/2015  IMPRESSION: Comminuted proximal ulna fracture. Anatomic distortion secondary to positioning and pain. Suspicion of posterior dislocation of the radial head.   Ct Elbow Right W/o Cm 05/19/2015  IMPRESSION: 1. Comminuted fracture of the olecranon process without articular surface involvement, with posterior displacement of the the major distal fracture fragment. Ulnohumeral alignment is normal. 2. Posterior dislocation of the radial head relative to the capitellum with the posteriorly dislocated radial head located posterior and distal to the capitellum. Fracture of the volar aspect of the radial head with the displaced radial head fracture fragment located along the anterior superior aspect of the capitellum with approximately 17 mm of distraction between the fracture fragment and the remainder of the radial head. 3. Small fracture fragment between the radial head and proximal ulna. 4. Large hematoma in the subcutaneous fat overlying the ulnar aspect of the elbow joint.   Dg Hip Unilat With Pelvis 2-3 Views Right 05/19/2015  IMPRESSION: Comminuted angulated overriding intertrochanteric fracture of the proximal right femur.  Osteoarthritis of the right hip joint.    Palma HolterKanishka G Gunadasa, MD 05/23/2015, 7:15 AM PGY-1, [facility] FPTS Intern pager: [phone], text pages welcome

## 2015-05-25 NOTE — ED Provider Notes (Signed)
CSN: [id number]     Arrival date & time 05/19/15  1431 History   First MD Initiated Contact with Patient 05/19/15 1432     Chief Complaint  Patient presents with  . Fall  . Elbow Injury  . Hip Pain      HPI To room via EMS. Pt getting out of van,thinks she stepped on uneven ground and fell on right side. Obvious deformity right elbow and pain to right hip. Fentanyl 100 mcg given by EMS, 10 min  Past Medical History  Diagnosis Date  . Parkinson disease (HCC)   . History of breast cancer    Past Surgical History  Procedure Laterality Date  . Breast surgery      right breast mastectomy  . Pituitary surgery    . Shoulder Left   . Abdominal hysterectomy    . Bladder repair    . Eye surgery Bilateral     cataract  . Orif elbow fracture Right 05/19/2015    Procedure: OPEN REDUCTION INTERNAL FIXATION (ORIF) ELBOW/OLECRANON FRACTURE;  Surgeon: Eldred MangesMark C Yates, MD;  Location: [facility] OR;  Service: Orthopedics;  Laterality: Right;  . Femur im nail Right 05/19/2015    Procedure: ORIF INTERTROCH HIP FX;  Surgeon: Eldred MangesMark C Yates, MD;  [facility];  Service: Orthopedics;  Laterality: Right;  . Radial head arthroplasty Right 05/19/2015    Procedure: RADIAL HEAD ARTHROPLASTY;  Surgeon: Eldred MangesMark C Yates, MD;  Location: [facility] OR;  Service: Orthopedics;  Laterality: Right;   Family History  Problem Relation Age of Onset  . Family history unknown: Yes   Social History  Substance Use Topics  . Smoking status: Never Smoker   . Smokeless tobacco: None  . Alcohol Use: No   OB History    No data available     Review of Systems  All other systems reviewed and are negative.     Allergies  Review of patient's allergies indicates no known allergies.  Home Medications   Prior to Admission medications   Medication Sig Start Date End Date Taking? Authorizing Provider  alendronate (FOSAMAX) 70 MG tablet Take 70 mg by mouth once a week. Take with a full glass of water on an empty stomach. - on  Mondays   Yes Historical Provider, MD  aspirin EC 81 MG tablet Take 81 mg by mouth daily.   Yes Historical Provider, MD  Calcium Carb-Cholecalciferol (CALCIUM 600 + D PO) Take 2 tablets by mouth daily.   Yes Historical Provider, MD  Carbidopa-Levodopa ER (SINEMET CR) 25-100 MG tablet controlled release Take 2 tablets by mouth 3 (three) times daily. Breakfast (8am), 4pm and 10pm   Yes Historical Provider, MD  Cyanocobalamin (VITAMIN B-12) 1000 MCG SUBL Place 1,000 mcg under the tongue daily.   Yes Historical Provider, MD  fluticasone (FLONASE) 50 MCG/ACT nasal spray Place 2 sprays into both nostrils daily as needed (seasonal allergies).   Yes Historical Provider, MD  Omega-3 Fatty Acids (FISH OIL) 1000 MG CAPS Take 1,000 mg by mouth 2 (two) times daily with a meal.   Yes Historical Provider, MD  polyethylene glycol (MIRALAX / GLYCOLAX) packet Take 17 g by mouth daily. Mix in coffee and drink   Yes Historical Provider, MD  Polyvinyl Alcohol-Povidone (REFRESH OP) Place 1 drop into both eyes 4 (four) times daily as needed (dry eyes).   Yes Historical Provider, MD  pramipexole (MIRAPEX) 0.125 MG tablet Take 0.125 mg by mouth at bedtime.   Yes Historical Provider, MD  pravastatin (  PRAVACHOL) 40 MG tablet Take 40 mg by mouth at bedtime.   Yes Historical Provider, MD  cephALEXin (KEFLEX) 250 MG capsule Take 1 capsule (250 mg total) by mouth every 6 (six) hours. 05/24/15   Palma Holter, MD  enoxaparin (LOVENOX) 30 MG/0.3ML injection Inject 0.3 mLs (30 mg total) into the skin every 12 (twelve) hours. 05/23/15 06/04/15  Palma Holter, MD  oxyCODONE-acetaminophen (ROXICET) 5-325 MG tablet Take 1 tablet by mouth every 6 (six) hours as needed for severe pain. 05/22/15   Naida Sleight, PA-C   BP 135/55 mmHg  Pulse 98  Temp(Src) 99.4 F (37.4 C) (Oral)  Resp 18  Ht  (1.6 m)  Wt 170 lb (77.111 kg)  BMI 30.12 kg/m2  SpO2 96% Physical Exam  Constitutional: She is oriented to person, place, and  time. She appears well-developed and well-nourished. No distress.  HENT:  Head: Normocephalic and atraumatic.  Eyes: Pupils are equal, round, and reactive to light.  Neck: Normal range of motion.  Cardiovascular: Normal rate and intact distal pulses.   Pulmonary/Chest: No respiratory distress.  Abdominal: Normal appearance. She exhibits no distension.  Musculoskeletal: She exhibits tenderness.       Right hip: She exhibits decreased range of motion, bony tenderness and deformity.  Neurological: She is alert and oriented to person, place, and time. No cranial nerve deficit.  Skin: Skin is warm and dry. No rash noted.  Psychiatric: She has a normal mood and affect. Her behavior is normal.  Nursing note and vitals reviewed.   ED Course  Procedures (including critical care time) Labs Review   Imaging Review Results for orders placed or performed during the hospital encounter of 05/19/15  Culture, Urine  Result Value Ref Range   Specimen Description URINE, RANDOM    Special Requests ADDED [id number]    Culture MULTIPLE SPECIES PRESENT, SUGGEST RECOLLECTION    Report Status 05/23/2015 FINAL   Basic metabolic panel  Result Value Ref Range   Sodium 139 135 - 145 mmol/L   Potassium 3.5 3.5 - 5.1 mmol/L   Chloride 109 101 - 111 mmol/L   CO2 22 22 - 32 mmol/L   Glucose, Bld 115 (H) 65 - 99 mg/dL   BUN 10 6 - 20 mg/dL   Creatinine, Ser 2.13 0.44 - 1.00 mg/dL   Calcium 9.3 8.9 - 08.6 mg/dL   GFR calc non Af Amer >60 >60 mL/min   GFR calc Af Amer >60 >60 mL/min   Anion gap 8 5 - 15  CBC with Differential/Platelet  Result Value Ref Range   WBC 12.5 (H) 4.0 - 10.5 K/uL   RBC 4.25 3.87 - 5.11 MIL/uL   Hemoglobin 12.0 12.0 - 15.0 g/dL   HCT 57.8 46.9 - 62.9 %   MCV 86.4 78.0 - 100.0 fL   MCH 28.2 26.0 - 34.0 pg   MCHC 32.7 30.0 - 36.0 g/dL   RDW 52.8 41.3 - 24.4 %   Platelets 262 150 - 400 K/uL   Neutrophils Relative % 84 %   Neutro Abs 10.5 (H) 1.7 - 7.7 K/uL   Lymphocytes  Relative 8 %   Lymphs Abs 1.0 0.7 - 4.0 K/uL   Monocytes Relative 7 %   Monocytes Absolute 0.9 0.1 - 1.0 K/uL   Eosinophils Relative 1 %   Eosinophils Absolute 0.1 0.0 - 0.7 K/uL   Basophils Relative 0 %   Basophils Absolute 0.0 0.0 - 0.1 K/uL  Urinalysis, Routine w reflex  microscopic (not at [facility])  Result Value Ref Range   Color, Urine YELLOW YELLOW   APPearance CLEAR CLEAR   Specific Gravity, Urine 1.017 1.005 - 1.030   pH 5.5 5.0 - 8.0   Glucose, UA NEGATIVE NEGATIVE mg/dL   Hgb urine dipstick NEGATIVE NEGATIVE   Bilirubin Urine NEGATIVE NEGATIVE   Ketones, ur NEGATIVE NEGATIVE mg/dL   Protein, ur NEGATIVE NEGATIVE mg/dL   Nitrite NEGATIVE NEGATIVE   Leukocytes, UA TRACE (A) NEGATIVE  Basic metabolic panel  Result Value Ref Range   Sodium 139 135 - 145 mmol/L   Potassium 3.6 3.5 - 5.1 mmol/L   Chloride 106 101 - 111 mmol/L   CO2 26 22 - 32 mmol/L   Glucose, Bld 129 (H) 65 - 99 mg/dL   BUN 13 6 - 20 mg/dL   Creatinine, Ser 1.61 0.44 - 1.00 mg/dL   Calcium 8.4 (L) 8.9 - 10.3 mg/dL   GFR calc non Af Amer >60 >60 mL/min   GFR calc Af Amer >60 >60 mL/min   Anion gap 7 5 - 15  CBC  Result Value Ref Range   WBC 11.0 (H) 4.0 - 10.5 K/uL   RBC 3.80 (L) 3.87 - 5.11 MIL/uL   Hemoglobin 10.6 (L) 12.0 - 15.0 g/dL   HCT 09.6 (L) 04.5 - 40.9 %   MCV 86.3 78.0 - 100.0 fL   MCH 27.9 26.0 - 34.0 pg   MCHC 32.3 30.0 - 36.0 g/dL   RDW 81.1 91.4 - 78.2 %   Platelets 252 150 - 400 K/uL  Urine microscopic-add on  Result Value Ref Range   Squamous Epithelial / LPF 0-5 (A) NONE SEEN   WBC, UA 0-5 0 - 5 WBC/hpf   RBC / HPF 0-5 0 - 5 RBC/hpf   Bacteria, UA RARE (A) NONE SEEN   Urine-Other MUCOUS PRESENT   CBC  Result Value Ref Range   WBC 9.0 4.0 - 10.5 K/uL   RBC 3.09 (L) 3.87 - 5.11 MIL/uL   Hemoglobin 8.7 (L) 12.0 - 15.0 g/dL   HCT 95.6 (L) 21.3 - 08.6 %   MCV 86.1 78.0 - 100.0 fL   MCH 28.2 26.0 - 34.0 pg   MCHC 32.7 30.0 - 36.0 g/dL   RDW 57.8 46.9 - 62.9 %   Platelets 194  150 - 400 K/uL  Basic metabolic panel  Result Value Ref Range   Sodium 134 (L) 135 - 145 mmol/L   Potassium 3.7 3.5 - 5.1 mmol/L   Chloride 104 101 - 111 mmol/L   CO2 24 22 - 32 mmol/L   Glucose, Bld 127 (H) 65 - 99 mg/dL   BUN 13 6 - 20 mg/dL   Creatinine, Ser 5.28 0.44 - 1.00 mg/dL   Calcium 7.9 (L) 8.9 - 10.3 mg/dL   GFR calc non Af Amer >60 >60 mL/min   GFR calc Af Amer >60 >60 mL/min   Anion gap 6 5 - 15  Urinalysis, Routine w reflex microscopic (not at [facility])  Result Value Ref Range   Color, Urine AMBER (A) YELLOW   APPearance CLOUDY (A) CLEAR   Specific Gravity, Urine 1.024 1.005 - 1.030   pH 5.0 5.0 - 8.0   Glucose, UA NEGATIVE NEGATIVE mg/dL   Hgb urine dipstick SMALL (A) NEGATIVE   Bilirubin Urine SMALL (A) NEGATIVE   Ketones, ur 15 (A) NEGATIVE mg/dL   Protein, ur 30 (A) NEGATIVE mg/dL   Nitrite NEGATIVE NEGATIVE   Leukocytes, UA SMALL (  A) NEGATIVE  Urine microscopic-add on  Result Value Ref Range   Squamous Epithelial / LPF 6-30 (A) NONE SEEN   WBC, UA TOO NUMEROUS TO COUNT 0 - 5 WBC/hpf   RBC / HPF 0-5 0 - 5 RBC/hpf   Bacteria, UA FEW (A) NONE SEEN  CBC  Result Value Ref Range   WBC 10.0 4.0 - 10.5 K/uL   RBC 3.27 (L) 3.87 - 5.11 MIL/uL   Hemoglobin 9.3 (L) 12.0 - 15.0 g/dL   HCT 96.0 (L) 45.4 - 09.8 %   MCV 85.9 78.0 - 100.0 fL   MCH 28.4 26.0 - 34.0 pg   MCHC 33.1 30.0 - 36.0 g/dL   RDW 11.9 14.7 - 82.9 %   Platelets 215 150 - 400 K/uL  VITAMIN D 25 Hydroxy (Vit-D Deficiency, Fractures)  Result Value Ref Range   Vit D, 25-Hydroxy 35.5 30.0 - 100.0 ng/mL  CBC  Result Value Ref Range   WBC 9.0 4.0 - 10.5 K/uL   RBC 2.97 (L) 3.87 - 5.11 MIL/uL   Hemoglobin 8.2 (L) 12.0 - 15.0 g/dL   HCT 56.2 (L) 13.0 - 86.5 %   MCV 85.5 78.0 - 100.0 fL   MCH 27.6 26.0 - 34.0 pg   MCHC 32.3 30.0 - 36.0 g/dL   RDW 78.4 69.6 - 29.5 %   Platelets 253 150 - 400 K/uL   Dg Chest 1 View  05/19/2015  CLINICAL DATA:  Pre operative respiratory exam.  Right hip fracture.  EXAM: CHEST 1 VIEW COMPARISON:  None. FINDINGS: Heart size and pulmonary vascularity are normal. Previous right mastectomy. 3 mm density overlying the anterior aspect of the right third rib is probably a granuloma. Lungs are otherwise clear. No acute osseous abnormality. IMPRESSION: No significant abnormality. Electronically Signed   By: Francene Boyers M.D.   On: 05/19/2015 16:01   Dg Elbow 2 Views Right  05/19/2015  CLINICAL DATA:  Fall.  Pain.  Initial encounter. EXAM: RIGHT ELBOW - 2 VIEW COMPARISON:  None. FINDINGS: Anatomic distortion, secondary to patient immobility and pain. Comminuted fracture of the proximal ulna. Suspect posterior dislocation of the radial head relative to the capitellum. Diffuse soft tissue swelling. IMPRESSION: Comminuted proximal ulna fracture. Anatomic distortion secondary to positioning and pain. Suspicion of posterior dislocation of the radial head. CT versus more complete radiographs should be considered for further characterization. Electronically Signed   By: Jeronimo Greaves M.D.   On: 05/19/2015 16:02     05/19/2015 19:41     Dg Hip Unilat With Pelvis 2-3 Views Right  05/19/2015  CLINICAL DATA:  Right hip pain after the patient fell exiting her car today. EXAM: DG HIP (WITH OR WITHOUT PELVIS) 2-3V RIGHT COMPARISON:  None. FINDINGS: There is a comminuted angulated over riding intertrochanteric fracture of the proximal right femur. The lesser trochanter is avulsed. The superior aspect of the greater trochanter is comminuted. Marginal osteophytes on the right femoral head and right acetabulum. Bilateral calcifications in the origins of the hamstring tendons. IMPRESSION: Comminuted angulated overriding intertrochanteric fracture of the proximal right femur. Osteoarthritis of the right hip joint. Electronically Signed   By: Francene Boyers M.D.   On: 05/19/2015 15:59       I have personally reviewed and evaluated these images and lab results as part of my medical  decision-making.   EKG Interpretation   Date/Time:  Saturday May 19 2015 15:01:07 EST Ventricular Rate:  91 PR Interval:  154 QRS Duration: 96 QT Interval:  378 QTC Calculation: 465 R Axis:   37 Text Interpretation:  Sinus rhythm Ventricular premature complex Abnormal  R-wave progression, early transition Abnormal ekg Confirmed by Radford Pax  MD,  Lyndel Dancel ([id number]) on 05/19/2015 3:04:32 PM      MDM   Final diagnoses:  Fall  Fracture  Surgery, elective  Surgery, elective        Nelva Nay, MD 05/25/15 1610

## 2015-05-31 ENCOUNTER — Encounter (HOSPITAL_COMMUNITY): Payer: Self-pay | Admitting: Orthopaedic Surgery

## 2016-10-07 IMAGING — CT CT ELBOW*R* W/O CM
1 of 5 series · 6 of 20 positions shown, 8 images · non-contrast
Comparison: None.

CLINICAL DATA: Fracture of the radius and olecranon. Status post
fall.

EXAM:
CT OF THE RIGHT ELBOW WITHOUT CONTRAST
TECHNIQUE: Multidetector CT imaging was performed according to the standard
protocol. Multiplanar CT image reconstructions were also generated.

[Series 208: soft tissue · axial · 0.25mm/px · z∈[-575,-405]mm · 6 of 112 slices shown, 8 images]
[im 18/112  soft-tissue]
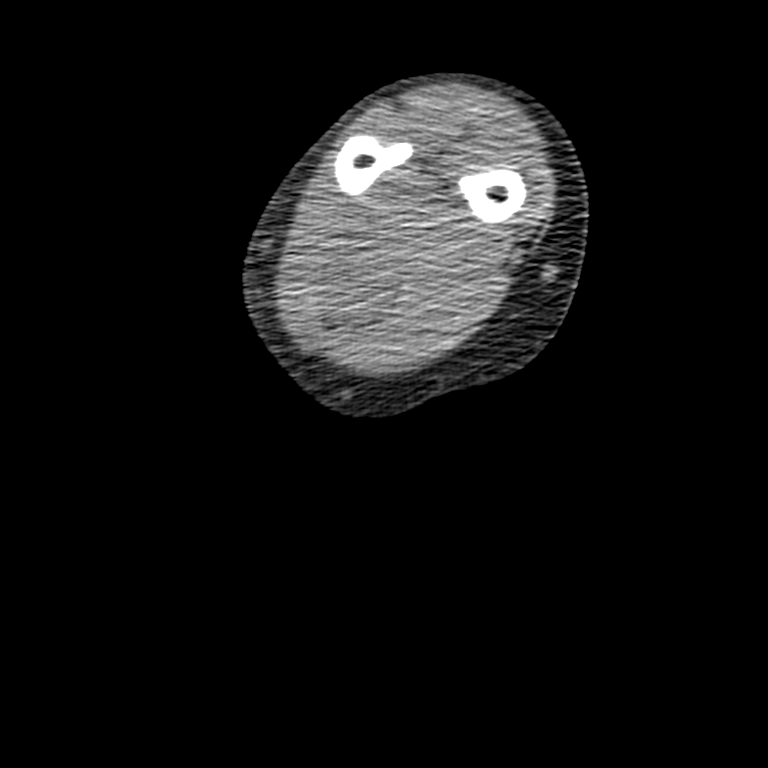
[im 18/112  bone]
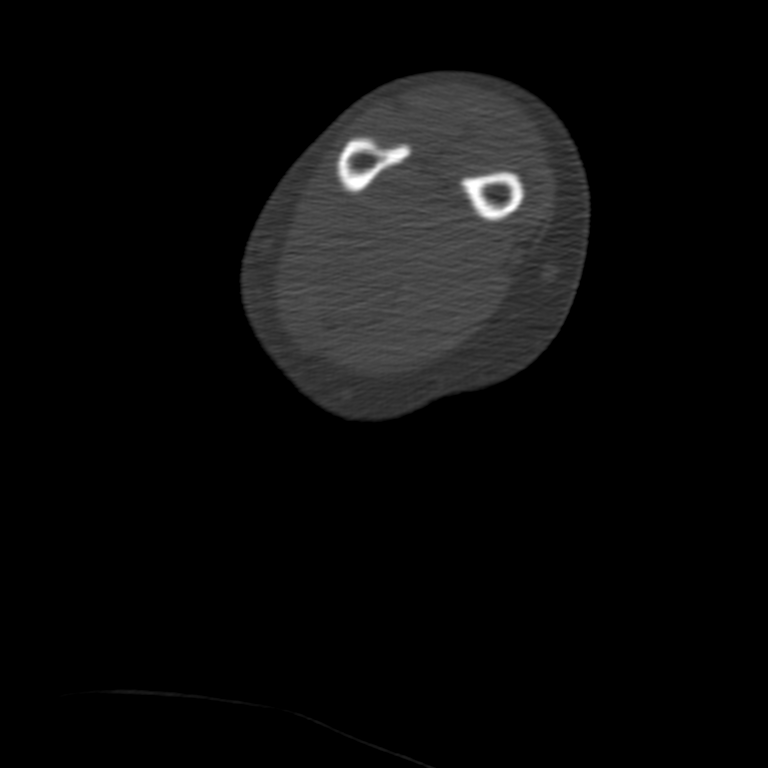
[im 35/112  bone]
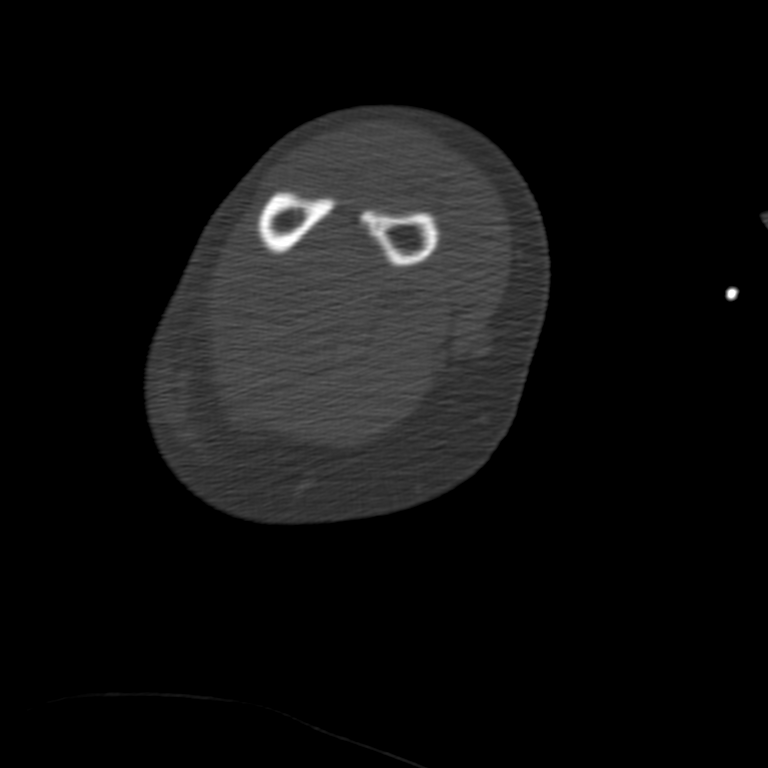
[im 52/112  bone]
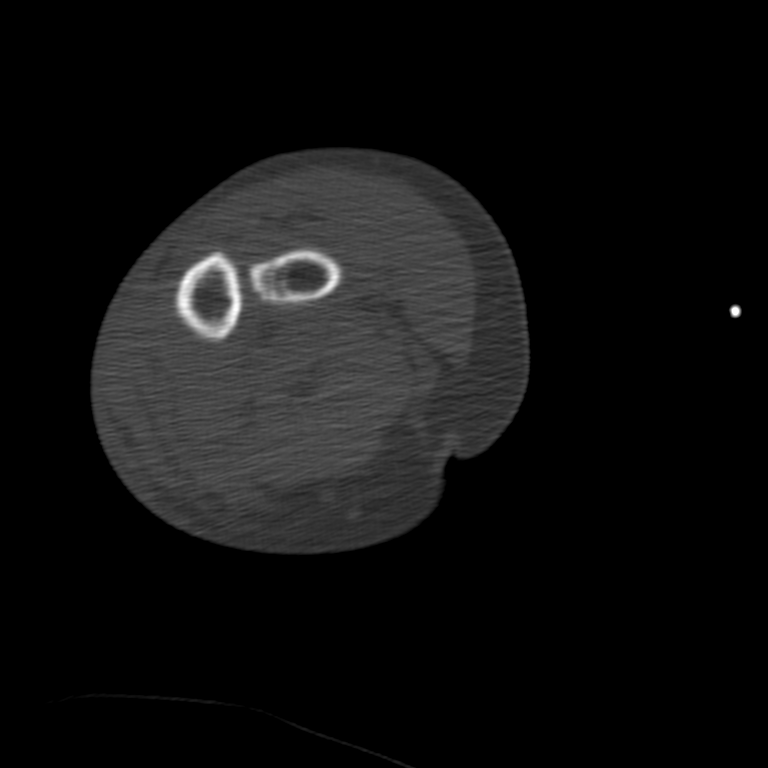
[im 69/112  bone]
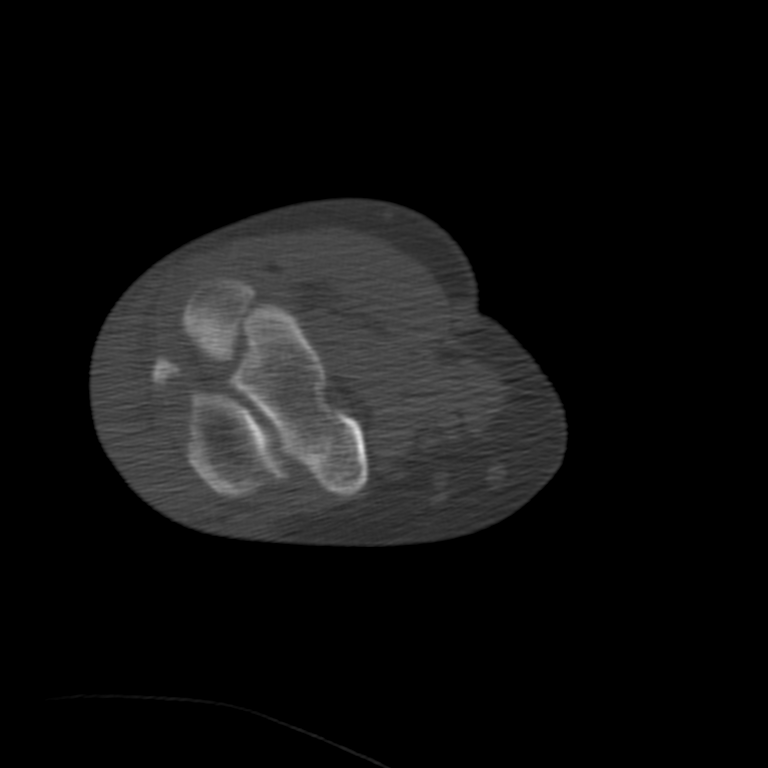
[im 86/112  soft-tissue]
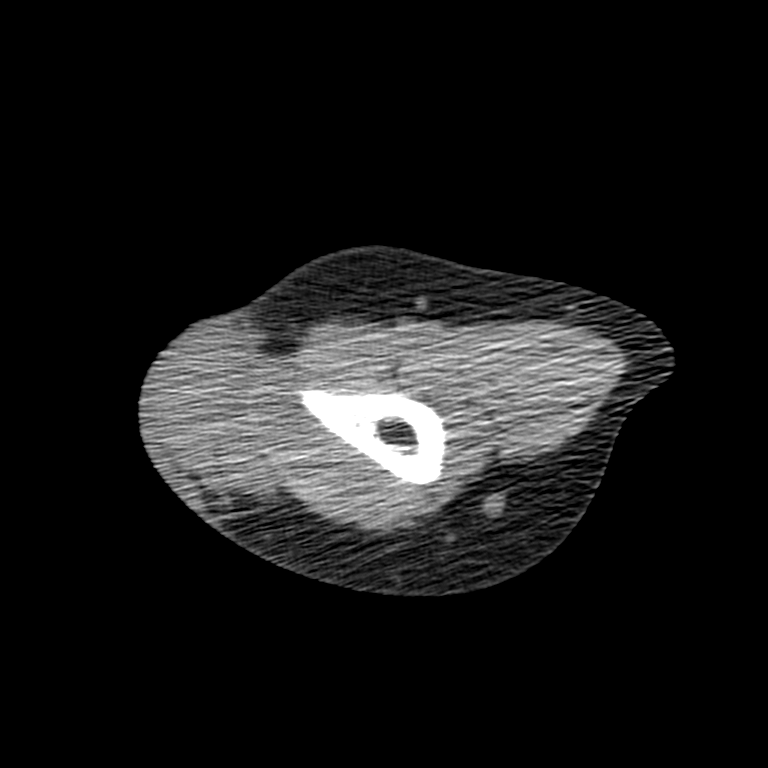
[im 86/112  bone]
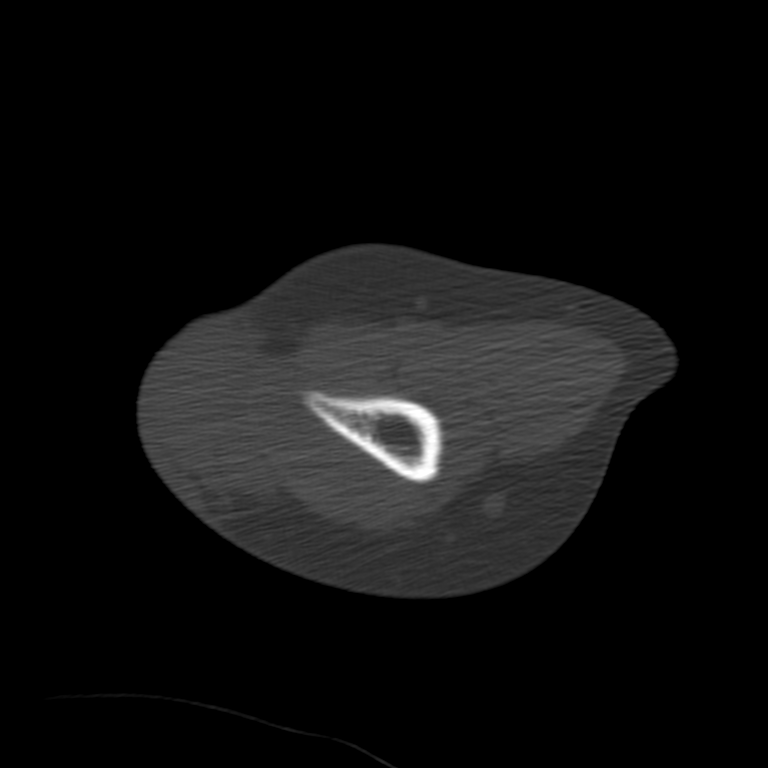
[im 103/112  bone]
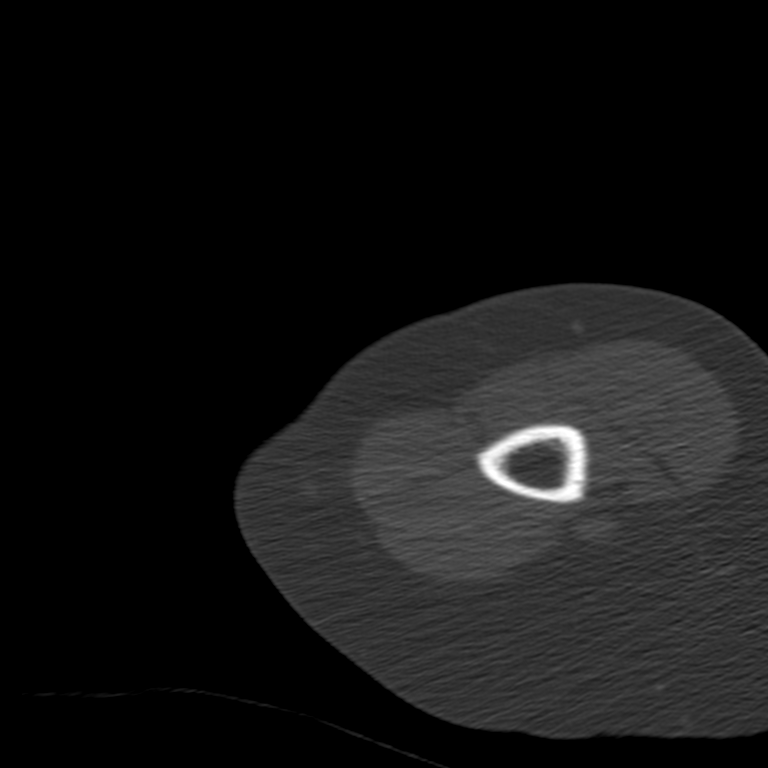

[6 of 20 positions shown; findings below may reference images not displayed]

FINDINGS: There is a comminuted fracture of the olecranon process without
articular surface involvement, with posterior displacement of the
the major distal fracture fragment. The ulnohumeral alignment is
normal.

There is posterior dislocation of the radial head relative to the
capitellum. The posteriorly dislocated radial head is located
posterior and distal to the capitellum. There is a fracture of the
volar aspect of the radial head with the displaced radial head
fracture fragment located along the anterior superior aspect of the
capitellum with approximately 17 mm of distraction between the
fracture fragment and the remainder of the radial head. There is a
small fracture fragment between the radial head and proximal ulna.

The muscles are normal. There is hyperdense material in along the
ulnar aspect of the elbow joint within the subcutaneous fat
consistent with a large hematoma measuring approximately 2.9 x 5.2 x
9.4 cm.
IMPRESSION: 1. Comminuted fracture of the olecranon process without articular
surface involvement, with posterior displacement of the the major
distal fracture fragment. Ulnohumeral alignment is normal.
2. Posterior dislocation of the radial head relative to the
capitellum with the posteriorly dislocated radial head located
posterior and distal to the capitellum. Fracture of the volar aspect
of the radial head with the displaced radial head fracture fragment
located along the anterior superior aspect of the capitellum with
approximately 17 mm of distraction between the fracture fragment and
the remainder of the radial head.
3. Small fracture fragment between the radial head and proximal
ulna.
4. Large hematoma in the subcutaneous fat overlying the ulnar aspect
of the elbow joint.

## 2016-10-07 IMAGING — DX DG HIP (WITH OR WITHOUT PELVIS) 2-3V*R*
3 series · 3 of 3 positions shown · non-contrast
Comparison: None.

CLINICAL DATA: Right hip pain after the patient fell exiting her
car today.

EXAM:
DG HIP (WITH OR WITHOUT PELVIS) 2-3V RIGHT

[pelvis ap]
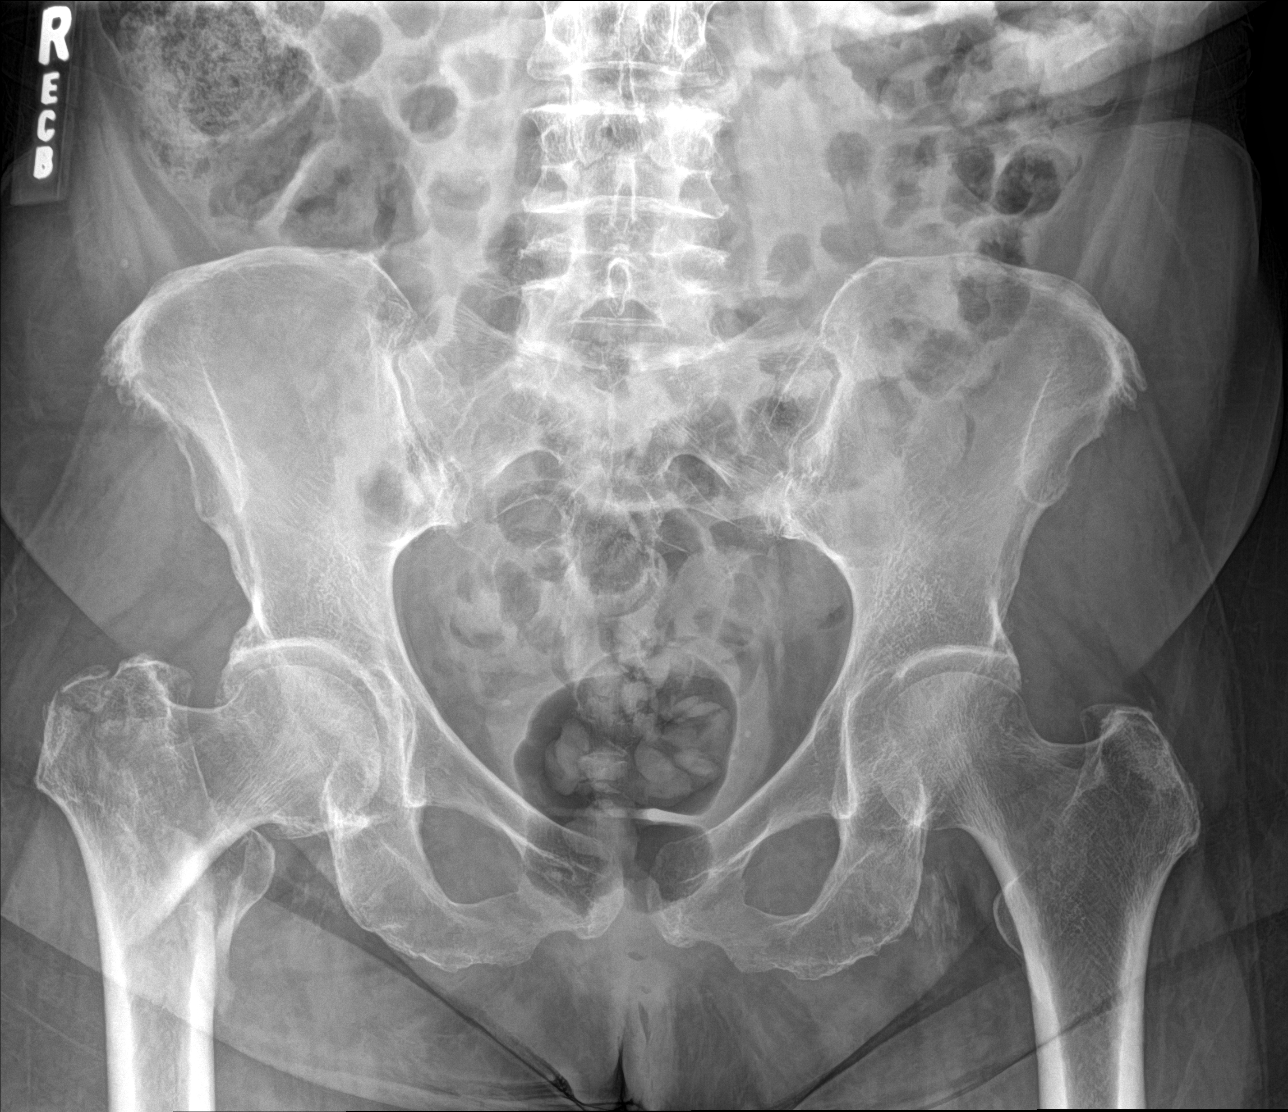

[hip ap]
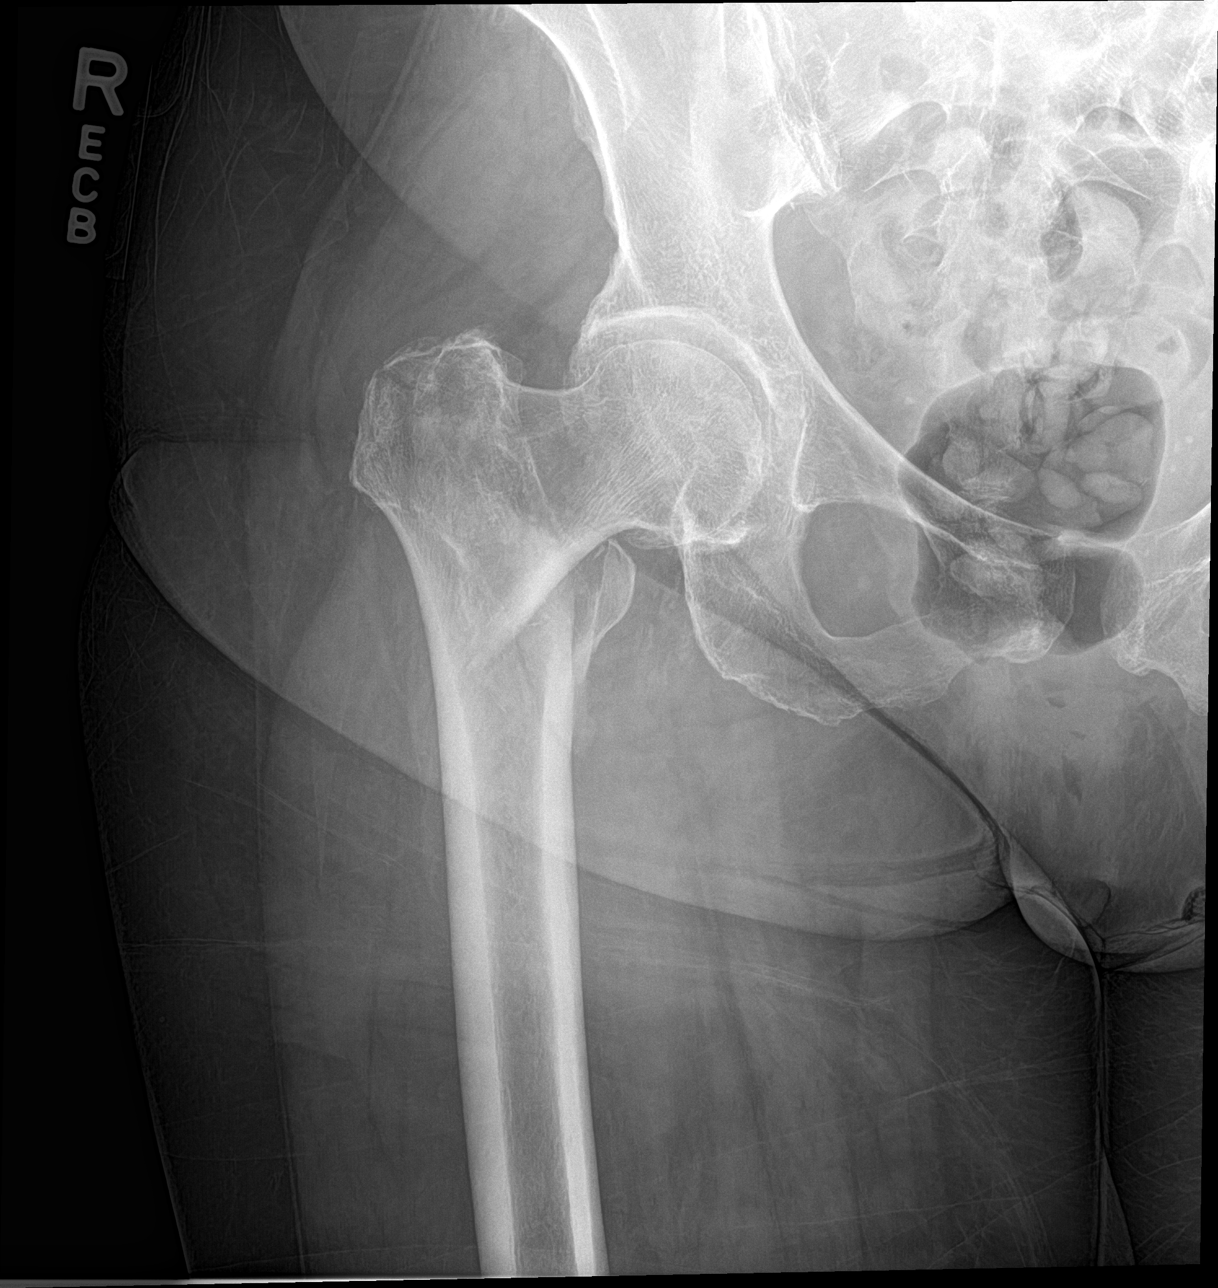

[hip lat]
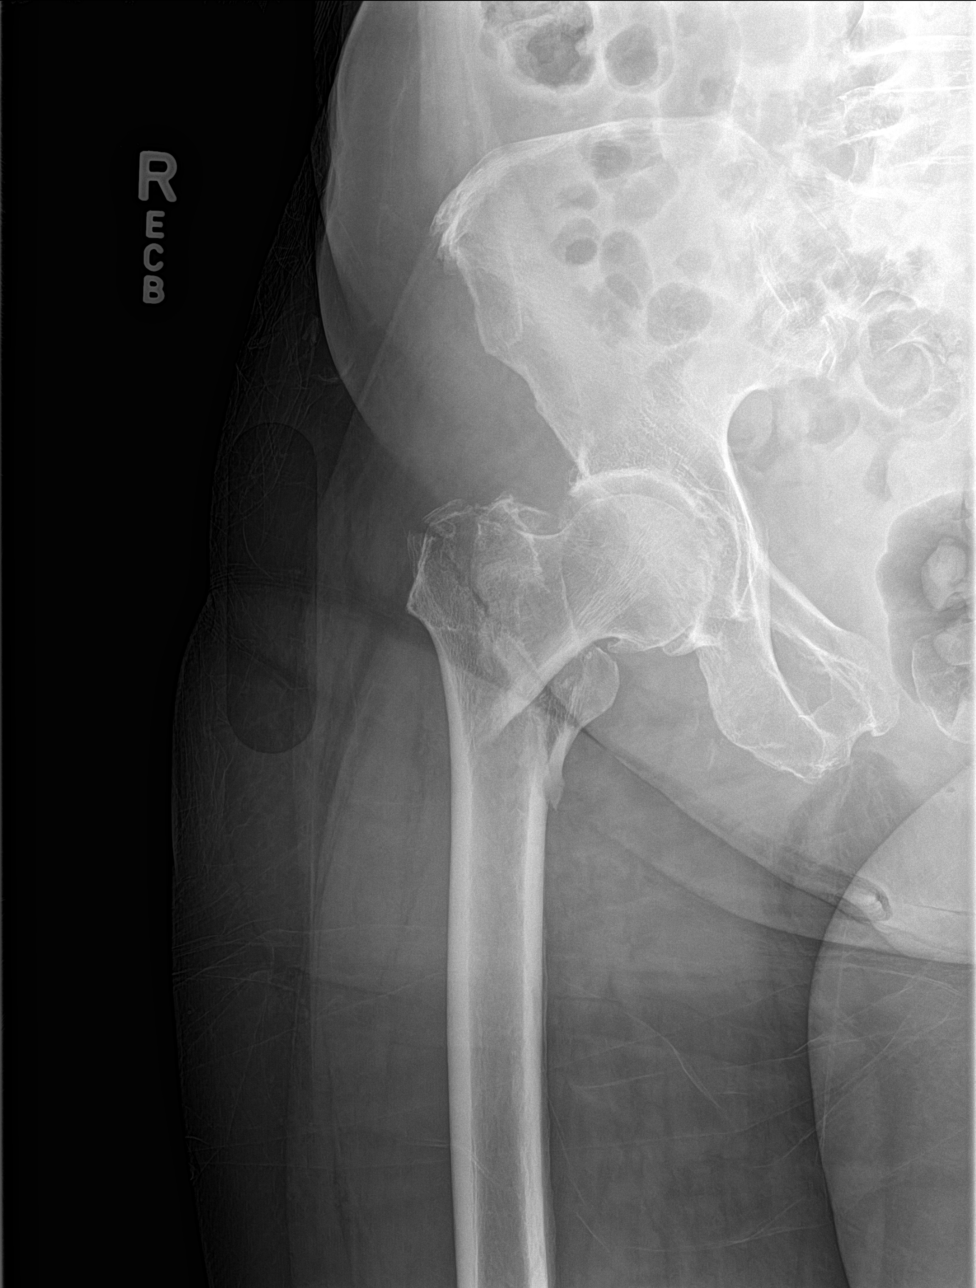

[3 of 3 positions shown; findings below may reference images not displayed]

FINDINGS: There is a comminuted angulated over riding intertrochanteric
fracture of the proximal right femur. The lesser trochanter is
avulsed. The superior aspect of the greater trochanter is
comminuted. Marginal osteophytes on the right femoral head and right
acetabulum.

Bilateral calcifications in the origins of the hamstring tendons.
IMPRESSION: Comminuted angulated overriding intertrochanteric fracture of the
proximal right femur. Osteoarthritis of the right hip joint.

## 2018-03-08 ENCOUNTER — Telehealth (INDEPENDENT_AMBULATORY_CARE_PROVIDER_SITE_OTHER): Payer: Self-pay | Admitting: Orthopaedic Surgery

## 2018-03-08 NOTE — Telephone Encounter (Signed)
[facility] records faxed to [facility] radiology oncology, Staci Acosta [phone], ph [phone]

## 2018-03-16 NOTE — Telephone Encounter (Signed)
I called. No contraindication for MRI from her elbow surgery. Discussed. She will proceed with MRI. FYI

## 2018-03-16 NOTE — Telephone Encounter (Signed)
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 Yates not toOphelia Shah have an MRI after her surgery. Records have already been sent but patient would like to know what to do, please advise # [phone]

## 2018-03-16 NOTE — Telephone Encounter (Signed)
Please advise 

## 2018-03-17 NOTE — Telephone Encounter (Signed)
noted 

## 2018-05-11 ENCOUNTER — Ambulatory Visit: Payer: Self-pay | Admitting: Allergy

## 2020-11-15 ENCOUNTER — Ambulatory Visit: Payer: Medicare PPO | Admitting: Surgery

## 2020-11-15 ENCOUNTER — Encounter: Payer: Self-pay | Admitting: Surgery

## 2020-11-15 ENCOUNTER — Other Ambulatory Visit: Payer: Self-pay

## 2020-11-15 ENCOUNTER — Ambulatory Visit: Payer: Self-pay

## 2020-11-15 VITALS — BP 112/72 | HR 65 | Temp 97.7°F

## 2020-11-15 DIAGNOSIS — M25521 Pain in right elbow: Secondary | ICD-10-CM | POA: Diagnosis not present

## 2020-11-15 DIAGNOSIS — I872 Venous insufficiency (chronic) (peripheral): Secondary | ICD-10-CM

## 2020-11-15 NOTE — Progress Notes (Signed)
Office Visit Note   Patient: Tina Shah           Date of Birth: [birth year]           MRN: [id number] Visit Date: 11/15/2020              Requested by: No referring provider defined for this encounter. PCP: No primary care provider on file.   Assessment & Plan: Visit Diagnoses:  1. Pain in right elbow   2. Venous insufficiency (chronic) (peripheral)     Plan: I advised patient that I do not see any obvious signs of new fracture.  Her hardware is intact.  I was planning on putting patient in a shoulder sling but with her history of unsteady gait and she needs to use a walker felt that this would make her more unsafe.  Stated that she does some boxing with physical therapy and I told her to hold off on this.  She will be careful and get as much help as she needs.  Follow-up with Dr. Ophelia Charter in 1 week for recheck.  Follow-Up Instructions: Return in about 1 week (around 11/22/2020) for with dr yates for recheck right elbow.  s/p fall..   Orders:  Orders Placed This Encounter  Procedures  . XR Elbow Complete Right (3+View)   No orders of the defined types were placed in this encounter.     Procedures: No procedures performed   Clinical Data: No additional findings.   Subjective: Chief Complaint  Patient presents with  . Right Elbow - Pain    HPI 80 year old white female comes in today with complaints of right elbow pain.  Patient is status post ORIF right olecranon fracture by Dr. Ophelia Charter May 19, 2015.  Patient admits to having ongoing balance issues with multiple falls.  She uses a walker.  2 weeks ago she was ambulating outside and fell onto the concrete hitting her right elbow.  States that she is has some swelling and increased heat.  She has been able to continue using her walker and weightbearing through her right arm without difficulty. Objective: Vital Signs: BP 112/72   Pulse 65   Temp 97.7 F (36.5 C)   Physical Exam HENT:     Head: Normocephalic.  Eyes:      Extraocular Movements: Extraocular movements intact.  Pulmonary:     Effort: No respiratory distress.  Skin:    Comments: Right elbow she has good range of motion.  Some swelling around the olecranon but not a lot.  Olecranon is mildly tender.  No deformity.  No signs of infection.  Neurovascular intact.  Neurological:     Mental Status: She is alert and oriented to person, place, and time.  Psychiatric:        Mood and Affect: Mood normal.     Ortho Exam  Specialty Comments:  No specialty comments available.  Imaging: No results found.   PMFS History: Patient Active Problem List   Diagnosis Date Noted  . Fever, unspecified   . Intertrochanteric fracture of right hip (HCC) 05/20/2015  . Fall   . Hip fracture (HCC) 05/19/2015   Past Medical History:  Diagnosis Date  . History of breast cancer   . Parkinson disease (HCC)     Family History  Family history unknown: Yes    Past Surgical History:  Procedure Laterality Date  . ABDOMINAL HYSTERECTOMY    . BLADDER REPAIR    . BREAST SURGERY     right  breast mastectomy  . EYE SURGERY Bilateral    cataract  . FEMUR IM NAIL Right 05/19/2015   Procedure: ORIF INTERTROCH HIP FX;  Surgeon: Eldred Manges, MD;  Location: [facility] OR;  Service: Orthopedics;  Laterality: Right;  . ORIF ELBOW FRACTURE Right 05/19/2015   Procedure: OPEN REDUCTION INTERNAL FIXATION (ORIF) ELBOW/OLECRANON FRACTURE;  Surgeon: Eldred Manges, MD;  Location: [facility] OR;  Service: Orthopedics;  Laterality: Right;  . PITUITARY SURGERY    . RADIAL HEAD ARTHROPLASTY Right 05/19/2015   Procedure: RADIAL HEAD ARTHROPLASTY;  Surgeon: Eldred Manges, MD;  Location: [facility] OR;  Service: Orthopedics;  Laterality: Right;  . shoulder Left    Social History   Occupational History  . Not on file  Tobacco Use  . Smoking status: Never Smoker  . Smokeless tobacco: Not on file  Substance and Sexual Activity  . Alcohol use: No  . Drug use: No  . Sexual activity: Not on file

## 2020-11-28 ENCOUNTER — Encounter: Payer: Self-pay | Admitting: Orthopaedic Surgery

## 2020-11-28 ENCOUNTER — Other Ambulatory Visit: Payer: Self-pay

## 2020-11-28 ENCOUNTER — Ambulatory Visit: Payer: Medicare PPO | Admitting: Orthopaedic Surgery

## 2020-11-28 DIAGNOSIS — S52021D Displaced fracture of olecranon process without intraarticular extension of right ulna, subsequent encounter for closed fracture with routine healing: Secondary | ICD-10-CM

## 2020-11-28 DIAGNOSIS — S52023A Displaced fracture of olecranon process without intraarticular extension of unspecified ulna, initial encounter for closed fracture: Secondary | ICD-10-CM | POA: Insufficient documentation

## 2020-11-28 NOTE — Progress Notes (Signed)
Office Visit Note   Patient: Tina Shah           Date of Birth: March 29, 1941           MRN: [id number] Visit Date: 11/28/2020              Requested by: No referring provider defined for this encounter. PCP: Hadley Pen, MD   Assessment & Plan: Visit Diagnoses:  1. Closed fracture of olecranon process of right ulna with routine healing, subsequent encounter     Plan: Olecranon fracture post fall with previous olecranon fixation and radial head replacement.  We discussed skipping the boxing since that puts a lot of wear and pressure on her radial head arthroplasty.  She will be used with the arm and I will have her return in 1 month repeat AP lateral right elbow on return to evaluate her for healing of the olecranon fracture.  Follow-Up Instructions: Return in about 1 month (around 12/28/2020).   Orders:  No orders of the defined types were placed in this encounter.  No orders of the defined types were placed in this encounter.     Procedures: No procedures performed   Clinical Data: No additional findings.   Subjective: Chief Complaint  Patient presents with  . Right Elbow - Pain    HPI 80 year old female returns she fell after 2016 surgery with olecranon fracture and radial head fracture requiring radial head replacement and ORIF with plating of olecranon.  She later had backout of the most proximal screw which was removed.  She has been doing 2 years of boxing class with a work on light bags also using 1 or 2 pound hand weights.  With her Parkinson's she has falling and uses rolling walker with a reverse seat and slipped and fell landing on her elbow.  She was seen and had x-rays after she fell and hit the concrete.  These x-rays are reviewed and shows essentially nondisplaced olecranon fracture.  Patient was given some teds for venous insufficiency by prescription states she is not able to get them on and there too tight she can even apply them.  She is going back to  [place] to take them back.  Review of Systems possible Parkinson's previous elbow surgery as scribed above.   Objective: Vital Signs: BP 128/73   Pulse 69   Ht 5\' 3"  (1.6 m)   Wt 149 lb (67.6 kg)   BMI 26.39 kg/m   Physical Exam Constitutional:      Appearance: She is well-developed.  HENT:     Head: Normocephalic.     Right Ear: External ear normal.     Left Ear: External ear normal.  Eyes:     Pupils: Pupils are equal, round, and reactive to light.  Neck:     Thyroid: No thyromegaly.     Trachea: No tracheal deviation.  Cardiovascular:     Rate and Rhythm: Normal rate.  Pulmonary:     Effort: Pulmonary effort is normal.  Abdominal:     Palpations: Abdomen is soft.  Skin:    General: Skin is warm and dry.  Neurological:     Mental Status: She is alert and oriented to person, place, and time.  Psychiatric:        Behavior: Behavior normal.     Ortho Exam  Specialty Comments:  No specialty comments available.  Imaging: No results found.   PMFS History: Patient Active Problem List   Diagnosis Date Noted  .  Olecranon fracture 11/28/2020  . Fever, unspecified   . Intertrochanteric fracture of right hip (HCC) 05/20/2015  . Fall   . Hip fracture (HCC) 05/19/2015   Past Medical History:  Diagnosis Date  . History of breast cancer   . Parkinson disease (HCC)     Family History  Family history unknown: Yes    Past Surgical History:  Procedure Laterality Date  . ABDOMINAL HYSTERECTOMY    . BLADDER REPAIR    . BREAST SURGERY     right breast mastectomy  . EYE SURGERY Bilateral    cataract  . FEMUR IM NAIL Right 05/19/2015   Procedure: ORIF INTERTROCH HIP FX;  Surgeon: Eldred Manges, MD;  Location: [facility] OR;  Service: Orthopedics;  Laterality: Right;  . ORIF ELBOW FRACTURE Right 05/19/2015   Procedure: OPEN REDUCTION INTERNAL FIXATION (ORIF) ELBOW/OLECRANON FRACTURE;  Surgeon: Eldred Manges, MD;  Location: [facility] OR;  Service: Orthopedics;  Laterality: Right;  .  PITUITARY SURGERY    . RADIAL HEAD ARTHROPLASTY Right 05/19/2015   Procedure: RADIAL HEAD ARTHROPLASTY;  Surgeon: Eldred Manges, MD;  Location: [facility] OR;  Service: Orthopedics;  Laterality: Right;  . shoulder Left    Social History   Occupational History  . Not on file  Tobacco Use  . Smoking status: Never Smoker  . Smokeless tobacco: Never Used  Substance and Sexual Activity  . Alcohol use: No  . Drug use: No  . Sexual activity: Not on file

## 2021-01-08 ENCOUNTER — Encounter: Payer: Self-pay | Admitting: Orthopaedic Surgery

## 2021-01-08 ENCOUNTER — Ambulatory Visit: Payer: Medicare PPO | Admitting: Orthopaedic Surgery

## 2021-01-08 ENCOUNTER — Other Ambulatory Visit: Payer: Self-pay

## 2021-01-08 ENCOUNTER — Ambulatory Visit (INDEPENDENT_AMBULATORY_CARE_PROVIDER_SITE_OTHER): Payer: Medicare PPO

## 2021-01-08 VITALS — BP 129/71 | HR 71 | Ht 63.0 in | Wt 150.0 lb

## 2021-01-08 DIAGNOSIS — S52021D Displaced fracture of olecranon process without intraarticular extension of right ulna, subsequent encounter for closed fracture with routine healing: Secondary | ICD-10-CM

## 2021-01-08 NOTE — Progress Notes (Signed)
Office Visit Note   Patient: Tina Shah           Date of Birth: August 28, 1940           MRN: [id number] Visit Date: 01/08/2021              Requested by: Hadley Pen, MD [address] [place],  [place] [address] PCP: Hadley Pen, MD   Assessment & Plan: Visit Diagnoses:  1. Closed fracture of olecranon process of right ulna with routine healing, subsequent encounter     Plan: We discussed she should skip the boxing activities after arthroplasty of her elbow with olecranon fracture.  She can continue working on balance and core strengthening exercises.  She can do some light weights overhead which they do with balance.  She can return as needed and note was given to the above recommendations.  Follow-Up Instructions: No follow-ups on file.   Orders:  Orders Placed This Encounter  Procedures   XR Elbow 2 Views Right   No orders of the defined types were placed in this encounter.     Procedures: No procedures performed   Clinical Data: No additional findings.   Subjective: Chief Complaint  Patient presents with   Right Elbow - Follow-up, Fracture    HPI patient returns for follow-up of right olecranon fracture discussion the elbow with radial head arthroplasty fixed in 2016.  She had increased symptoms with her elbow doing a boxing class.  No problems doing balance exercises or with core strengthening.  Since last seen she stopped the boxing states her elbow is back to doing normal and no longer hurts.  She been in a chair doing your blocking exercises working on the speed bag with increased elbow discomfort primarily posteriorly over the distal triceps and olecranon.  Review of Systems all the systems updated unchanged.  Of note is previous hip fracture as well as elbow fracture dislocation.   Objective: Vital Signs: BP 129/71   Pulse 71   Ht 5\' 3"  (1.6 m)   Wt 150 lb (68 kg)   BMI 26.57 kg/m   Physical Exam Constitutional:      Appearance:  She is well-developed.  HENT:     Head: Normocephalic.     Right Ear: External ear normal.     Left Ear: External ear normal. There is no impacted cerumen.  Eyes:     Pupils: Pupils are equal, round, and reactive to light.  Neck:     Thyroid: No thyromegaly.     Trachea: No tracheal deviation.  Cardiovascular:     Rate and Rhythm: Normal rate.  Pulmonary:     Effort: Pulmonary effort is normal.  Abdominal:     Palpations: Abdomen is soft.  Musculoskeletal:     Cervical back: No rigidity.  Skin:    General: Skin is warm and dry.  Neurological:     Mental Status: She is alert and oriented to person, place, and time.  Psychiatric:        Behavior: Behavior normal.    Ortho Exam right elbow incisions well-healed.  No elbow effusion.  No tenderness over the radial head arthroplasty.  No discomfort with active or passive range of motion.  Specialty Comments:  No specialty comments available.  Imaging: No results found.   PMFS History: Patient Active Problem List   Diagnosis Date Noted   Olecranon fracture 11/28/2020   Fever, unspecified    Intertrochanteric fracture of right hip (  HCC) 05/20/2015   Fall    Hip fracture (HCC) 05/19/2015   Past Medical History:  Diagnosis Date   History of breast cancer    Parkinson disease (HCC)     Family History  Family history unknown: Yes    Past Surgical History:  Procedure Laterality Date   ABDOMINAL HYSTERECTOMY     BLADDER REPAIR     BREAST SURGERY     right breast mastectomy   EYE SURGERY Bilateral    cataract   FEMUR IM NAIL Right 05/19/2015   Procedure: ORIF INTERTROCH HIP FX;  Surgeon: Eldred Manges, MD;  Location: [facility] OR;  Service: Orthopedics;  Laterality: Right;   ORIF ELBOW FRACTURE Right 05/19/2015   Procedure: OPEN REDUCTION INTERNAL FIXATION (ORIF) ELBOW/OLECRANON FRACTURE;  Surgeon: Eldred Manges, MD;  Location: [facility] OR;  Service: Orthopedics;  Laterality: Right;   PITUITARY SURGERY     RADIAL HEAD ARTHROPLASTY  Right 05/19/2015   Procedure: RADIAL HEAD ARTHROPLASTY;  Surgeon: Eldred Manges, MD;  Location: [facility] OR;  Service: Orthopedics;  Laterality: Right;   shoulder Left    Social History   Occupational History   Not on file  Tobacco Use   Smoking status: Never   Smokeless tobacco: Never  Substance and Sexual Activity   Alcohol use: No   Drug use: No   Sexual activity: Not on file

## 2023-03-18 ENCOUNTER — Inpatient Hospital Stay (HOSPITAL_COMMUNITY): Payer: Medicare PPO

## 2023-03-18 ENCOUNTER — Emergency Department (HOSPITAL_COMMUNITY): Payer: Medicare PPO

## 2023-03-18 ENCOUNTER — Inpatient Hospital Stay (HOSPITAL_COMMUNITY)
Admission: EM | Admit: 2023-03-18 | Discharge: 2023-03-19 | DRG: 083 | Disposition: A | Discharge status: Hospice | Payer: Medicare PPO | Source: Skilled Nursing Facility | Attending: Internal Medicine | Admitting: Internal Medicine

## 2023-03-18 DIAGNOSIS — E785 Hyperlipidemia, unspecified: Secondary | ICD-10-CM | POA: Diagnosis present

## 2023-03-18 DIAGNOSIS — Z66 Do not resuscitate: Secondary | ICD-10-CM | POA: Diagnosis present

## 2023-03-18 DIAGNOSIS — F329 Major depressive disorder, single episode, unspecified: Secondary | ICD-10-CM | POA: Diagnosis present

## 2023-03-18 DIAGNOSIS — Z79899 Other long term (current) drug therapy: Secondary | ICD-10-CM | POA: Diagnosis not present

## 2023-03-18 DIAGNOSIS — D649 Anemia, unspecified: Secondary | ICD-10-CM | POA: Diagnosis present

## 2023-03-18 DIAGNOSIS — R4182 Altered mental status, unspecified: Principal | ICD-10-CM

## 2023-03-18 DIAGNOSIS — Z515 Encounter for palliative care: Secondary | ICD-10-CM | POA: Diagnosis not present

## 2023-03-18 DIAGNOSIS — S062XAA Diffuse traumatic brain injury with loss of consciousness status unknown, initial encounter: Secondary | ICD-10-CM | POA: Diagnosis present

## 2023-03-18 DIAGNOSIS — F02B0 Dementia in other diseases classified elsewhere, moderate, without behavioral disturbance, psychotic disturbance, mood disturbance, and anxiety: Secondary | ICD-10-CM

## 2023-03-18 DIAGNOSIS — R112 Nausea with vomiting, unspecified: Secondary | ICD-10-CM

## 2023-03-18 DIAGNOSIS — Z9011 Acquired absence of right breast and nipple: Secondary | ICD-10-CM

## 2023-03-18 DIAGNOSIS — G9340 Encephalopathy, unspecified: Secondary | ICD-10-CM | POA: Diagnosis present

## 2023-03-18 DIAGNOSIS — Y92129 Unspecified place in nursing home as the place of occurrence of the external cause: Secondary | ICD-10-CM | POA: Diagnosis not present

## 2023-03-18 DIAGNOSIS — Z7982 Long term (current) use of aspirin: Secondary | ICD-10-CM | POA: Diagnosis not present

## 2023-03-18 DIAGNOSIS — Z8673 Personal history of transient ischemic attack (TIA), and cerebral infarction without residual deficits: Secondary | ICD-10-CM | POA: Diagnosis not present

## 2023-03-18 DIAGNOSIS — Z888 Allergy status to other drugs, medicaments and biological substances status: Secondary | ICD-10-CM | POA: Diagnosis not present

## 2023-03-18 DIAGNOSIS — S0633AA Contusion and laceration of cerebrum, unspecified, with loss of consciousness status unknown, initial encounter: Secondary | ICD-10-CM

## 2023-03-18 DIAGNOSIS — R296 Repeated falls: Secondary | ICD-10-CM | POA: Diagnosis present

## 2023-03-18 DIAGNOSIS — W19XXXA Unspecified fall, initial encounter: Secondary | ICD-10-CM | POA: Diagnosis present

## 2023-03-18 DIAGNOSIS — K567 Ileus, unspecified: Secondary | ICD-10-CM | POA: Diagnosis present

## 2023-03-18 DIAGNOSIS — S065XAA Traumatic subdural hemorrhage with loss of consciousness status unknown, initial encounter: Secondary | ICD-10-CM | POA: Diagnosis present

## 2023-03-18 DIAGNOSIS — Z7983 Long term (current) use of bisphosphonates: Secondary | ICD-10-CM | POA: Diagnosis not present

## 2023-03-18 DIAGNOSIS — G20A1 Parkinson's disease without dyskinesia, without mention of fluctuations: Secondary | ICD-10-CM

## 2023-03-18 DIAGNOSIS — I629 Nontraumatic intracranial hemorrhage, unspecified: Secondary | ICD-10-CM

## 2023-03-18 DIAGNOSIS — R569 Unspecified convulsions: Secondary | ICD-10-CM | POA: Diagnosis present

## 2023-03-18 LAB — CBC
HCT: 35.9 % — ABNORMAL LOW (ref 36.0–46.0)
Hemoglobin: 11.2 g/dL — ABNORMAL LOW (ref 12.0–15.0)
MCH: 25.7 pg — ABNORMAL LOW (ref 26.0–34.0)
MCHC: 31.2 g/dL (ref 30.0–36.0)
MCV: 82.5 fL (ref 80.0–100.0)
Platelets: 322 10*3/uL (ref 150–400)
RBC: 4.35 MIL/uL (ref 3.87–5.11)
RDW: 17.2 % — ABNORMAL HIGH (ref 11.5–15.5)
WBC: 9.4 10*3/uL (ref 4.0–10.5)
nRBC: 0 % (ref 0.0–0.2)

## 2023-03-18 LAB — COMPREHENSIVE METABOLIC PANEL WITH GFR
ALT: 7 U/L (ref 0–44)
AST: 18 U/L (ref 15–41)
Albumin: 3.4 g/dL — ABNORMAL LOW (ref 3.5–5.0)
Alkaline Phosphatase: 117 U/L (ref 38–126)
Anion gap: 11 (ref 5–15)
BUN: 12 mg/dL (ref 8–23)
CO2: 24 mmol/L (ref 22–32)
Calcium: 9 mg/dL (ref 8.9–10.3)
Chloride: 102 mmol/L (ref 98–111)
Creatinine, Ser: 0.7 mg/dL (ref 0.44–1.00)
GFR, Estimated: >60 mL/min (ref >60)
Glucose, Bld: 149 mg/dL — ABNORMAL HIGH (ref 70–99)
Potassium: 3.6 mmol/L (ref 3.5–5.1)
Sodium: 137 mmol/L (ref 135–145)
Total Bilirubin: 0.4 mg/dL (ref 0.3–1.2)
Total Protein: 6.8 g/dL (ref 6.5–8.1)

## 2023-03-18 LAB — I-STAT CHEM 8, ED
BUN: 12 mg/dL (ref 8–23)
Calcium, Ion: 1.25 mmol/L (ref 1.15–1.40)
Chloride: 101 mmol/L (ref 98–111)
Creatinine, Ser: 0.5 mg/dL (ref 0.44–1.00)
Glucose, Bld: 143 mg/dL — ABNORMAL HIGH (ref 70–99)
HCT: 37 % (ref 36.0–46.0)
Hemoglobin: 12.6 g/dL (ref 12.0–15.0)
Potassium: 3.6 mmol/L (ref 3.5–5.1)
Sodium: 139 mmol/L (ref 135–145)
TCO2: 24 mmol/L (ref 22–32)

## 2023-03-18 LAB — LIPASE, BLOOD: Lipase: 37 U/L (ref 11–51)

## 2023-03-18 MED ORDER — IOHEXOL 350 MG/ML SOLN
75.0000 mL | Freq: Once | INTRAVENOUS | Status: AC | PRN
  Administered 2023-03-18: 75 mL via INTRAVENOUS

## 2023-03-18 MED ORDER — ACETAMINOPHEN 650 MG RE SUPP
650.0000 mg | Freq: Once | RECTAL | Status: AC
  Administered 2023-03-18: 650 mg via RECTAL
  Filled 2023-03-18: qty 1

## 2023-03-18 MED ORDER — ENOXAPARIN SODIUM 40 MG/0.4ML IJ SOSY
40.0000 mg | PREFILLED_SYRINGE | INTRAMUSCULAR | Status: DC
  Administered 2023-03-18: 40 mg via SUBCUTANEOUS
  Filled 2023-03-18: qty 0.4

## 2023-03-18 MED ORDER — LEVETIRACETAM IN NACL 1500 MG/100ML IV SOLN
1500.0000 mg | Freq: Once | INTRAVENOUS | Status: AC
  Administered 2023-03-18: 1500 mg via INTRAVENOUS
  Filled 2023-03-18: qty 100

## 2023-03-18 NOTE — ED Notes (Signed)
Patient transported to MRI 

## 2023-03-18 NOTE — ED Provider Notes (Signed)
[place] EMERGENCY DEPARTMENT AT [facility] Provider Note   CSN: [id number] Arrival date & time: 03/18/23  0710     History  Chief Complaint  Patient presents with   Seizures    Tina Shah is a 82 y.o. female.  Pt with hx Parkinson's, cva, with vomiting brownish material this AM, and then suspected seizure by EMS. EMS gave versed 5 mg iv.  Pt not verbally responsive. Has dnr paperwork and med list from SNF.  No report of trauma/fall. No report of fever. No report of history of seizures.   The history is provided by the patient, the EMS personnel and medical records. The history is limited by the condition of the patient.  Seizures      Home Medications Prior to Admission medications   Medication Sig Start Date End Date Taking? Authorizing Provider  alendronate (FOSAMAX) 70 MG tablet Take 70 mg by mouth once a week. Take with a full glass of water on an empty stomach. - on Mondays    [provider]  aspirin EC 81 MG tablet Take 81 mg by mouth daily.    [provider]  Calcium Carb-Cholecalciferol (CALCIUM 600 + D PO) Take 2 tablets by mouth daily.    [provider]  Carbidopa-Levodopa ER (SINEMET CR) 25-100 MG tablet controlled release Take 2 tablets by mouth 3 (three) times daily. Breakfast (8am), 4pm and 10pm    [provider]  cephALEXin (KEFLEX) 250 MG capsule Take 1 capsule (250 mg total) by mouth every 6 (six) hours. Patient not taking: No sig reported 05/24/15   Palma Holter, MD  Cyanocobalamin (VITAMIN B-12) 1000 MCG SUBL Place 1,000 mcg under the tongue daily.    [provider]  enoxaparin (LOVENOX) 30 MG/0.3ML injection Inject 0.3 mLs (30 mg total) into the skin every 12 (twelve) hours. 05/23/15 06/04/15  Palma Holter, MD  fluticasone (FLONASE) 50 MCG/ACT nasal spray Place 2 sprays into both nostrils daily as needed (seasonal allergies).    [provider]  Omega-3 Fatty Acids (FISH  OIL) 1000 MG CAPS Take 1,000 mg by mouth 2 (two) times daily with a meal.    [provider]  oxyCODONE-acetaminophen (ROXICET) 5-325 MG tablet Take 1 tablet by mouth every 6 (six) hours as needed for severe pain. Patient not taking: No sig reported 05/22/15   Naida Sleight, PA-C  polyethylene glycol [facility] / GLYCOLAX) packet Take 17 g by mouth daily. Mix in coffee and drink    [provider]  Polyvinyl Alcohol-Povidone (REFRESH OP) Place 1 drop into both eyes 4 (four) times daily as needed (dry eyes).    [provider]  pramipexole (MIRAPEX) 0.125 MG tablet Take 0.125 mg by mouth at bedtime.    [provider]  pravastatin (PRAVACHOL) 40 MG tablet Take 40 mg by mouth at bedtime.    [provider]      Allergies    Cetirizine    Review of Systems   Review of Systems  Unable to perform ROS: Patient unresponsive  Neurological:  Positive for seizures.    Physical Exam Updated Vital Signs BP 120/62   Pulse (!) 103   Temp (!) 100.4 F (38 C) (Axillary)   Resp 19   SpO2 98%  Physical Exam Vitals and nursing note reviewed.  Constitutional:      Appearance: Normal appearance. She is well-developed.  HENT:     Head: Atraumatic.     Nose: Nose  normal.     Mouth/Throat:     Mouth: Mucous membranes are moist.  Eyes:     General: No scleral icterus.    Conjunctiva/sclera: Conjunctivae normal.     Pupils: Pupils are equal, round, and reactive to light.  Neck:     Vascular: No carotid bruit.     Trachea: No tracheal deviation.     Comments: No stiffness or rigidity Cardiovascular:     Rate and Rhythm: Normal rate and regular rhythm.     Pulses: Normal pulses.     Heart sounds: Normal heart sounds. No murmur heard.    No friction rub. No gallop.  Pulmonary:     Effort: Pulmonary effort is normal. No respiratory distress.     Breath sounds: Normal breath sounds.  Abdominal:     General: Bowel sounds are normal. There is no  distension.     Palpations: Abdomen is soft. There is no mass.     Tenderness: There is no abdominal tenderness. There is no guarding.  Genitourinary:    Comments: No cva tenderness.  Musculoskeletal:        General: No swelling or tenderness.     Cervical back: Normal range of motion and neck supple. No rigidity. No muscular tenderness.     Comments: CTLS spine, non tender, aligned, no step off. No focal extremity swelling or deformity noted.   Skin:    General: Skin is warm and dry.     Findings: No rash.  Neurological:     Mental Status: She is alert.     Comments: Altered, not following commands. Withdraws extremities.      ED Results / Procedures / Treatments   Labs (all labs ordered are listed, but only abnormal results are displayed) Results for orders placed or performed during the hospital encounter of 03/18/23  Comprehensive metabolic panel  Result Value Ref Range   Sodium 137 135 - 145 mmol/L   Potassium 3.6 3.5 - 5.1 mmol/L   Chloride 102 98 - 111 mmol/L   CO2 24 22 - 32 mmol/L   Glucose, Bld 149 (H) 70 - 99 mg/dL   BUN 12 8 - 23 mg/dL   Creatinine, Ser 1.19 0.44 - 1.00 mg/dL   Calcium 9.0 8.9 - 14.7 mg/dL   Total Protein 6.8 6.5 - 8.1 g/dL   Albumin 3.4 (L) 3.5 - 5.0 g/dL   AST 18 15 - 41 U/L   ALT 7 0 - 44 U/L   Alkaline Phosphatase 117 38 - 126 U/L   Total Bilirubin 0.4 0.3 - 1.2 mg/dL   GFR, Estimated >82 >95 mL/min   Anion gap 11 5 - 15  CBC  Result Value Ref Range   WBC 9.4 4.0 - 10.5 K/uL   RBC 4.35 3.87 - 5.11 MIL/uL   Hemoglobin 11.2 (L) 12.0 - 15.0 g/dL   HCT 62.1 (L) 30.8 - 65.7 %   MCV 82.5 80.0 - 100.0 fL   MCH 25.7 (L) 26.0 - 34.0 pg   MCHC 31.2 30.0 - 36.0 g/dL   RDW 84.6 (H) 96.2 - 95.2 %   Platelets 322 150 - 400 K/uL   nRBC 0.0 0.0 - 0.2 %  Lipase, blood  Result Value Ref Range   Lipase 37 11 - 51 U/L  I-stat chem 8, ED  Result Value Ref Range   Sodium 139 135 - 145 mmol/L   Potassium 3.6 3.5 - 5.1 mmol/L   Chloride 101 98 -  111 mmol/L  BUN 12 8 - 23 mg/dL   Creatinine, Ser 1.61 0.44 - 1.00 mg/dL   Glucose, Bld 096 (H) 70 - 99 mg/dL   Calcium, Ion 0.45 4.09 - 1.40 mmol/L   TCO2 24 22 - 32 mmol/L   Hemoglobin 12.6 12.0 - 15.0 g/dL   HCT 81.1 91.4 - 78.2 %     EKG EKG Interpretation Date/Time:  Wednesday March 18 2023 08:34:45 EDT Ventricular Rate:  107 PR Interval:  159 QRS Duration:  109 QT Interval:  363 QTC Calculation: 492 R Axis:   80  Text Interpretation: Sinus tachycardia Incomplete left bundle branch block Borderline prolonged QT interval Nonspecific T wave abnormality Artifact Confirmed by Cathren Laine ([id number]) on 03/18/2023 8:58:36 AM  Radiology CT ABDOMEN PELVIS W CONTRAST  Result Date: 03/18/2023 CLINICAL DATA:  Seizure and abdominal pain EXAM: CT ABDOMEN AND PELVIS WITH CONTRAST TECHNIQUE: Multidetector CT imaging of the abdomen and pelvis was performed using the standard protocol following bolus administration of intravenous contrast. RADIATION DOSE REDUCTION: This exam was performed according to the departmental dose-optimization program which includes automated exposure control, adjustment of the mA and/or kV according to patient size and/or use of iterative reconstruction technique. CONTRAST:  75mL OMNIPAQUE IOHEXOL 350 MG/ML SOLN COMPARISON:  CT abdomen and pelvis dated 10/30/2022, 12/15/2011 FINDINGS: Lower chest: No focal consolidation or pulmonary nodule in the lung bases. No pleural effusion or pneumothorax demonstrated. Partially imaged heart size is normal. Coronary artery calcifications. Hepatobiliary: Unchanged 1.7 cm cyst near the caudate lobe (3:27) dating back to 2013. Additional scattered subcentimeter hypodensities in segment 2 and 4, too small to characterize but also unchanged. No intra or extrahepatic biliary ductal dilation. Normal gallbladder. Pancreas: No focal lesions or main ductal dilation. Spleen: Normal in size without focal abnormality. Adrenals/Urinary Tract:  Unchanged 2.5 cm left adrenal nodule measures 56 HU, likely adenoma. No specific follow-up imaging recommended. No right adrenal nodule. No suspicious renal mass, calculi or hydronephrosis. Bilateral simple-appearing parapelvic cysts. No specific follow-up imaging recommended. No focal bladder wall thickening. Stomach/Bowel: Small to moderate hiatal hernia. Normal appearance of the stomach. Few loops of dilated small bowel, for example left lower quadrant (6:54) without abrupt caliber transition. No abnormal mural thickening. Appendix is not discretely seen. Vascular/Lymphatic: Aortic atherosclerosis. No enlarged abdominal or pelvic lymph nodes. Reproductive: No adnexal masses. Other: No free fluid, fluid collection, or free air. Musculoskeletal: No acute or abnormal lytic or blastic osseous lesions. Multilevel degenerative changes of the partially imaged thoracic and lumbar spine. Unchanged T10 and L3 compression deformities. Multiple old right rib fractures. Partially imaged bilateral proximal femoral fixation. Hardware appears intact. Old left proximal femoral fracture. IMPRESSION: 1. Dilated rectum contains large volume stool. 2. Few dilated loops of small bowel, likely ileus. 3. Small to moderate hiatal hernia. 4. Aortic Atherosclerosis (ICD10-I70.0). Coronary artery calcifications. Assessment for potential risk factor modification, dietary therapy or pharmacologic therapy may be warranted, if clinically indicated. Electronically Signed   By: Agustin Cree M.D.   On: 03/18/2023 10:20   CT Head Wo Contrast  Result Date: 03/18/2023 CLINICAL DATA:  Seizures, abdominal pain, altered mental status EXAM: CT HEAD WITHOUT CONTRAST TECHNIQUE: Contiguous axial images were obtained from the base of the skull through the vertex without intravenous contrast. RADIATION DOSE REDUCTION: This exam was performed according to the departmental dose-optimization program which includes automated exposure control, adjustment of the  mA and/or kV according to patient size and/or use of iterative reconstruction technique. COMPARISON:  CT head 12/09/2022 FINDINGS: Brain: There is  hypodensity with loss of gray-white differentiation in the left temporal lobe. There is intraparenchymal hemorrhage measuring 1.2 cm x 0.4 cm as well as probable subdural blood along the undersurface of the temporal lobe and left tentorial leaflet (5-53). There is regional sulcal effacement but no significant mass effect and no midline shift. There is pneumocephalus overlying the left cerebral hemisphere of uncertain origin. There is no other evidence of acute intracranial hemorrhage or acute territorial infarct. Background parenchymal volume is normal. Gray-white differentiation is otherwise preserved. The ventricles are normal in size. The pituitary and suprasellar region are normal. There is no mass lesion. There is no mass effect or midline shift. Vascular: No hyperdense vessel or unexpected calcification. Skull: No definite calvarial fracture is seen. Sinuses/Orbits: There is mild mucosal thickening in the paranasal sinuses. Bilateral lens implants are in place. The globes and orbits are otherwise unremarkable. Other: There is opacification of the left middle ear cavity. IMPRESSION: 1. Hypodensity in the left temporal lobe with a small focus of intraparenchymal hemorrhage consistent with a hemorrhagic contusion, as well as small volume subdural blood over the left tentorial leaflet and overlying the temporal lobe. No mass effect or midline shift. 2. Pneumocephalus overlying the left cerebral hemisphere of uncertain etiology but possibly reflecting an occult temporal bone fracture given opacification of the middle ear cavity. Consider dedicated temporal bone CT for further evaluation. Critical Value/emergent results were called by telephone at the time of interpretation on 03/18/2023 at 9:59 am to provider [facility] , who verbally acknowledged these results.  Electronically Signed   By: Lesia Hausen M.D.   On: 03/18/2023 10:01   DG Chest Port 1 View  Result Date: 03/18/2023 CLINICAL DATA:  altered ms EXAM: PORTABLE CHEST 1 VIEW COMPARISON:  CXR 05/22/22 FINDINGS: Assessment is limited due to patient positioning. Within this limitation- No pleural effusion. No pneumothorax. Likely normal cardiac and mediastinal contours. Redemonstrated multiple left-sided chronic rib fractures. There is a subdiaphragmatic lucency on the left that could represent superimposition of gastric and colonic gaseous distention, but if there is clinical concern for pneumoperitoneum, further evaluation with dedicated abdominal radiograph is recommended. IMPRESSION: Limited exam due to patient positioning 1. No focal airspace opacity 2. There is a subdiaphragmatic lucency on the left that could represent superimposition of gas distended loops of bowel and/or stomach, but if there is clinical concern for pneumoperitoneum, further evaluation with dedicated abdominal radiograph is recommended. Electronically Signed   By: Lorenza Cambridge M.D.   On: 03/18/2023 08:57    Procedures Procedures    Medications Ordered in ED Medications  iohexol (OMNIPAQUE) 350 MG/ML injection 75 mL (75 mLs Intravenous Contrast Given 03/18/23 0941)    ED Course/ Medical Decision Making/ A&P                                 Medical Decision Making Problems Addressed: Acute alteration in mental status: acute illness or injury with systemic symptoms that poses a threat to life or bodily functions Acute intra-cranial hemorrhage (HCC): acute illness or injury with systemic symptoms that poses a threat to life or bodily functions Focal hemorrhagic contusion of cerebrum (HCC): acute illness or injury with systemic symptoms that poses a threat to life or bodily functions 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, unspecified whether behavioral, psychotic, or mood disturbance or anxiety (HCC): chronic illness or  injury with exacerbation, progression, or side effects of treatment that poses a threat to life  or bodily functions Nausea and vomiting in adult: acute illness or injury with systemic symptoms Seizure [facility]): acute illness or injury with systemic symptoms that poses a threat to life or bodily functions Subdural hematoma (HCC): acute illness or injury  Amount and/or Complexity of Data Reviewed Independent Historian: EMS    Details: Ems/fam, hx External Data Reviewed: labs and notes. Labs: ordered. Decision-making details documented in ED Course. Radiology: ordered and independent interpretation performed. Decision-making details documented in ED Course. ECG/medicine tests: ordered and independent interpretation performed. Decision-making details documented in ED Course. Discussion of management or test interpretation with external provider(s): Neurosurgery, medicine  Risk Prescription drug management. Decision regarding hospitalization.   Iv ns. Continuous pulse ox and cardiac monitoring. Labs ordered/sent. Imaging ordered.   Differential diagnosis includes ICH, sbo, seizure, etc. Dispo decision including potential need for admission considered - will get labs and imaging and reassess.   Reviewed nursing notes and prior charts for additional history. External reports reviewed. Additional history from: EMS.   Cardiac monitor: sinus rhythm, rate 99.  Keppra iv.   Labs reviewed/interpreted by me - chem normal. Wbc normal. Hct 36.   Xrays reviewed/interpreted by me - no pna.   CT reviewed/interpreted by me - +ich/sdh. Neurosurgery consulted-  they reviewed ct, and will see in ED. Agree w keppra iv, and indicate medicine admission.   Medicine consulted for admission.  Son present, discussed results/plan. He indicates since patient at SNF w progressive general weakness and decreased mobility in past weeks, currently in bed, or occasionally moved to chair. At baseline confused,  periodically calls out, generally cannot have oriented conversation but will converse albeit in confused/disoriented manner. Also confirms is followed by hospice, would want medical tx, but no cpr/dni. Indicates no definite report of fall/trauma, but that prior to coming to ED today his understanding is that patient was on ground on moved to ground.   CRITICAL CARE RE: acute alteration in mental status, ICH/SDH, seizure.  Performed by: Suzi Roots Total critical care time: 45 minutes Critical care time was exclusive of separately billable procedures and treating other patients. Critical care was necessary to treat or prevent imminent or life-threatening deterioration. Critical care was time spent personally by me on the following activities: development of treatment plan with patient and/or surrogate as well as nursing, discussions with consultants, evaluation of patient's response to treatment, examination of patient, obtaining history from patient or surrogate, ordering and performing treatments and interventions, ordering and review of laboratory studies, ordering and review of radiographic studies, pulse oximetry and re-evaluation of patient's condition.            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

## 2023-03-18 NOTE — Progress Notes (Signed)
Called in regards to this patient's head CT. No history of fall but CT shows a left temporal hemorrhagic contusion. Given her age, medical history, DNR and hospice status there is no neurosurgical intervention needed at this time.

## 2023-03-18 NOTE — H&P (Cosign Needed Addendum)
Date: 03/18/2023               Patient Name:  Tina Shah MRN: [id number]  DOB: [birth year] Age / Sex: 82 y.o., female   PCP: Hadley Pen, MD         Medical Service: Internal Medicine Teaching Service         Attending Physician: Dr. Mercie Eon, MD      First Contact: Dr. Carmina Miller, DO Pager [phone]    Second Contact: Dr. Marrianne Mood, MD Pager [phone]         After Hours (After 5p/  First Contact Pager: [phone]  weekends / holidays): Second Contact Pager: [phone]   SUBJECTIVE   Chief Complaint: found unresponsive at [facility]  History of Present Illness:  Tina Shah is an 82 year old with PMH of parkinsons disease, recurrent UTIs, HLD, MDD who present after being found on bed at memory care unit with emesis beside her. Her son, Onalee Hua was at bedside and answered questions as patient was unresponsive. He reports that she called him yesterday and asked if they were going to the beach. Her speech seemed a bit slow but improved with duration of conversation. He was called around 7AM by [facility] stating that she was being sent to hospital. They did not report a fall. There was concern that emesis was dark appearing. She has no history of GI bleed. At baseline she is a little confused about situation but oriented to person and family. She was moved to memory care unit for more supervision as she was having recurrent falls due to parkinsons. Her family transitioned her to hospice a few weeks ago and appreciate the additional support provided. Onalee Hua is frustrated by [employer] and had been working on a living situation for his mom that would involve 24/ 7 care at a private residence.  ED Course: Vomitied brown material in AM, seizure suspected by EMS. She was given Keppra  Febrile to 100.4, tachycardic to 100s  CT showed left temporal hemorrhagic contusion  Meds:  Sinemet 50-200 mg Fluticasone 50 Advil 600 mg PRN Melatonin 3 mg Oxycodone 5 mg PRN Pramipexole 0.5  mg Pravastatin 10 mg Senna Sertraline 50 mg  Past Medical History  Past Surgical History:  Procedure Laterality Date   ABDOMINAL HYSTERECTOMY     BLADDER REPAIR     BREAST SURGERY     right breast mastectomy   EYE SURGERY Bilateral    cataract   FEMUR IM NAIL Right 05/19/2015   Procedure: ORIF INTERTROCH HIP FX;  Surgeon: Eldred Manges, MD;  Location: [facility] OR;  Service: Orthopedics;  Laterality: Right;   ORIF ELBOW FRACTURE Right 05/19/2015   Procedure: OPEN REDUCTION INTERNAL FIXATION (ORIF) ELBOW/OLECRANON FRACTURE;  Surgeon: Eldred Manges, MD;  Location: [facility] OR;  Service: Orthopedics;  Laterality: Right;   PITUITARY SURGERY     RADIAL HEAD ARTHROPLASTY Right 05/19/2015   Procedure: RADIAL HEAD ARTHROPLASTY;  Surgeon: Eldred Manges, MD;  Location: [facility] OR;  Service: Orthopedics;  Laterality: Right;   shoulder Left     Social:  Lives With: [facility] Occupation: retired Support: david  Level of Function: needs help with bathing and eating PCP: [facility] Substances: none  Allergies: Allergies as of 03/18/2023 - Reviewed 03/18/2023  Allergen Reaction Noted   Cetirizine Other (See Comments) 07/12/2019    Review of Systems: A complete ROS was negative except as per HPI.   OBJECTIVE:   Physical Exam: Blood pressure (!) 100/57, pulse Marland Kitchen)  104, temperature (!) 100.4 F (38 C), temperature source Axillary, resp. rate 20, SpO2 97%.  Constitutional: responds to pain only, pupils are equal and reactive to light Cardiovascular: regular rate and rhythm, no m/r/g Pulmonary/Chest: normal work of breathing on room air, lungs clear to auscultation bilaterally Abdominal: soft, tenderness to lower quadrant, no guarding MSK: bruising and scabs over arms, no new bruising present Neurological: does not open eyes. Moves lower extremities to touch  Labs: CBC    Component Value Date/Time   WBC 9.4 03/18/2023 0724   RBC 4.35 03/18/2023 0724   HGB 12.6 03/18/2023 0844   HCT 37.0  03/18/2023 0844   PLT 322 03/18/2023 0724   MCV 82.5 03/18/2023 0724   MCH 25.7 (L) 03/18/2023 0724   MCHC 31.2 03/18/2023 0724   RDW 17.2 (H) 03/18/2023 0724   LYMPHSABS 1.0 05/19/2015 1645   MONOABS 0.9 05/19/2015 1645   EOSABS 0.1 05/19/2015 1645   BASOSABS 0.0 05/19/2015 1645     CMP     Component Value Date/Time   NA 139 03/18/2023 0844   K 3.6 03/18/2023 0844   CL 101 03/18/2023 0844   CO2 24 03/18/2023 0724   GLUCOSE 143 (H) 03/18/2023 0844   BUN 12 03/18/2023 0844   CREATININE 0.50 03/18/2023 0844   CALCIUM 9.0 03/18/2023 0724   PROT 6.8 03/18/2023 0724   ALBUMIN 3.4 (L) 03/18/2023 0724   AST 18 03/18/2023 0724   ALT 7 03/18/2023 0724   ALKPHOS 117 03/18/2023 0724   BILITOT 0.4 03/18/2023 0724   GFRNONAA >60 03/18/2023 0724   GFRAA >60 05/21/2015 0348    Imaging: CT abd/ Pelvis IMPRESSION: 1. Dilated rectum contains large volume stool. 2. Few dilated loops of small bowel, likely ileus. 3. Small to moderate hiatal hernia. 4. Aortic Atherosclerosis (ICD10-I70.0). Coronary artery calcifications. Assessment for potential risk factor modification, dietary therapy or pharmacologic therapy may be warranted, if clinically indicated.  CT head IMPRESSION: 1. Hypodensity in the left temporal lobe with a small focus of intraparenchymal hemorrhage consistent with a hemorrhagic contusion, as well as small volume subdural blood over the left tentorial leaflet and overlying the temporal lobe. No mass effect or midline shift. 2. Pneumocephalus overlying the left cerebral hemisphere of uncertain etiology but possibly reflecting an occult temporal bone fracture given opacification of the middle ear cavity. Consider dedicated temporal bone CT for further evaluation.   Critical Value/emergent results were called by telephone at the time of interpretation on 03/18/2023 at 9:59 am to provider [facility] , who verbally acknowledged these results.  EKG: sinus  tacycardia  ASSESSMENT & PLAN:   Assessment & Plan by Problem: Active Problems:   Subdural hematoma (HCC)   Tina Shah is a 82 y.o. person living with a history of parkinsons who presented with encephalopathy and admitted for hemorrhagic contusion of left temporal lobe on hospital day 0  Hemorrhagic contusion of left temporal lobe Small subdural hematoma Patient found unresponsive by [facility] staff this morning. No report of falls by facility. I was not able to reach [facility] staff today to confirm story. CT showed contusion of left temporal lobe with small subdural. Neurosurgery was consulted by ED provider but no surgical intervention possible as subdural is small. For now her blood pressure is stable and she is mildly tachycardic. She had low grade fever that I think is from swelling.  -MRI to prognosticate, but prognosis is poor -family agrees with DNR/ DNI, they would like to see how she does over  the next few days. Her son Onalee Hua wants to be called with updates if her condition worsens. -progressive  Ileus She has trouble with constipation due to parkinsons and age. CT ab showed ileus with large volume of stool in rectum. -NPO - holding off on NG tube as no additional episodes of vomiting -made require disimpaction  Chronic anemia Baseline hgb around 8-10 with hgb at 1.2 but appears to be concentrated. Low suspician for GI bleed. Vomiting likely from ileus.  Diet: NPO VTE: SCDs Code: DNR/DNI  Prior to Admission Living Arrangement: SNF, memory care unit Anticipated Discharge Location: pending Barriers to Discharge: prognosis  Dispo: Admit patient to Inpatient with expected length of stay greater than 2 midnights.  Signed: Rudene Christians, DO Internal Medicine Resident PGY-3  03/18/2023, 5:35 PM

## 2023-03-18 NOTE — ED Notes (Signed)
MD Steinl notified and aware of fever.  Awaiting orders.

## 2023-03-18 NOTE — ED Notes (Signed)
Family at bedside. 

## 2023-03-18 NOTE — ED Notes (Signed)
MD Denton Lank notified and aware of pt axillary temperature

## 2023-03-18 NOTE — ED Notes (Signed)
Awaiting Keppra from main pharmacy

## 2023-03-18 NOTE — ED Triage Notes (Signed)
Pt BIB [facility] from [facility] due to seizure like activity.  Staff reports that patient was normal at 0500 during rounds.  Shortly after they found her in a pool of foam and coffee ground vomit and had seized.  EMS reports that her right eye deviation per staff has gotten worse.  Pt is on hospice.  Pt did have seizure in EMS truck; was given 5mg  Versed IM.

## 2023-03-19 DIAGNOSIS — Z515 Encounter for palliative care: Secondary | ICD-10-CM

## 2023-03-19 DIAGNOSIS — G20A1 Parkinson's disease without dyskinesia, without mention of fluctuations: Secondary | ICD-10-CM | POA: Diagnosis not present

## 2023-03-19 DIAGNOSIS — F02B0 Dementia in other diseases classified elsewhere, moderate, without behavioral disturbance, psychotic disturbance, mood disturbance, and anxiety: Secondary | ICD-10-CM

## 2023-03-19 DIAGNOSIS — S065XAA Traumatic subdural hemorrhage with loss of consciousness status unknown, initial encounter: Secondary | ICD-10-CM | POA: Diagnosis not present

## 2023-03-19 DIAGNOSIS — I629 Nontraumatic intracranial hemorrhage, unspecified: Secondary | ICD-10-CM

## 2023-03-19 LAB — CBC
HCT: 30.7 % — ABNORMAL LOW (ref 36.0–46.0)
Hemoglobin: 9.4 g/dL — ABNORMAL LOW (ref 12.0–15.0)
MCH: 24.5 pg — ABNORMAL LOW (ref 26.0–34.0)
MCHC: 30.6 g/dL (ref 30.0–36.0)
MCV: 79.9 fL — ABNORMAL LOW (ref 80.0–100.0)
Platelets: 270 10*3/uL (ref 150–400)
RBC: 3.84 MIL/uL — ABNORMAL LOW (ref 3.87–5.11)
RDW: 17.7 % — ABNORMAL HIGH (ref 11.5–15.5)
WBC: 7.8 10*3/uL (ref 4.0–10.5)
nRBC: 0 % (ref 0.0–0.2)

## 2023-03-19 LAB — BASIC METABOLIC PANEL
Anion gap: 14 (ref 5–15)
BUN: 12 mg/dL (ref 8–23)
CO2: 22 mmol/L (ref 22–32)
Calcium: 8.4 mg/dL — ABNORMAL LOW (ref 8.9–10.3)
Chloride: 102 mmol/L (ref 98–111)
Creatinine, Ser: 0.68 mg/dL (ref 0.44–1.00)
GFR, Estimated: >60 mL/min (ref >60)
Glucose, Bld: 108 mg/dL — ABNORMAL HIGH (ref 70–99)
Potassium: 3.2 mmol/L — ABNORMAL LOW (ref 3.5–5.1)
Sodium: 138 mmol/L (ref 135–145)

## 2023-03-19 MED ORDER — GLYCOPYRROLATE 0.2 MG/ML IJ SOLN: 0.2000 mg | INTRAMUSCULAR | Status: DC | PRN

## 2023-03-19 MED ORDER — MORPHINE SULFATE (PF) 2 MG/ML IV SOLN: 1.0000 mg | INTRAVENOUS | Status: DC | PRN

## 2023-03-19 MED ORDER — LORAZEPAM 2 MG/ML IJ SOLN: 1.0000 mg | INTRAMUSCULAR | Status: DC | PRN

## 2023-03-19 MED ORDER — GLYCOPYRROLATE 0.2 MG/ML IJ SOLN: 0.2000 mg | INTRAMUSCULAR | 0 refills | Status: AC | PRN

## 2023-03-19 MED ORDER — HALOPERIDOL LACTATE 2 MG/ML PO CONC: 0.5000 mg | ORAL | 0 refills | Status: DC | PRN

## 2023-03-19 MED ORDER — HALOPERIDOL 0.5 MG PO TABS: 0.5000 mg | ORAL_TABLET | ORAL | Status: DC | PRN

## 2023-03-19 MED ORDER — HALOPERIDOL LACTATE 5 MG/ML IJ SOLN: 0.5000 mg | INTRAMUSCULAR | 0 refills | Status: DC | PRN

## 2023-03-19 MED ORDER — LORAZEPAM 2 MG/ML PO CONC: 1.0000 mg | ORAL | Status: DC | PRN

## 2023-03-19 MED ORDER — LORAZEPAM 1 MG PO TABS: 1.0000 mg | ORAL_TABLET | ORAL | Status: DC | PRN

## 2023-03-19 MED ORDER — HALOPERIDOL LACTATE 5 MG/ML IJ SOLN: 0.5000 mg | INTRAMUSCULAR | Status: DC | PRN

## 2023-03-19 MED ORDER — HALOPERIDOL 0.5 MG PO TABS: 0.5000 mg | ORAL_TABLET | ORAL | 0 refills | Status: DC | PRN

## 2023-03-19 MED ORDER — MORPHINE SULFATE (PF) 2 MG/ML IV SOLN: 1.0000 mg | INTRAVENOUS | 0 refills | Status: DC | PRN

## 2023-03-19 MED ORDER — LORAZEPAM 2 MG/ML PO CONC: 1.0000 mg | ORAL | 0 refills | Status: DC | PRN

## 2023-03-19 MED ORDER — HALOPERIDOL LACTATE 2 MG/ML PO CONC: 0.5000 mg | ORAL | 0 refills | Status: AC | PRN

## 2023-03-19 MED ORDER — HALOPERIDOL 0.5 MG PO TABS: 0.5000 mg | ORAL_TABLET | ORAL | 0 refills | Status: AC | PRN

## 2023-03-19 MED ORDER — HALOPERIDOL LACTATE 2 MG/ML PO CONC: 0.5000 mg | ORAL | Status: DC | PRN

## 2023-03-19 MED ORDER — GLYCOPYRROLATE 1 MG PO TABS: 1.0000 mg | ORAL_TABLET | ORAL | Status: DC | PRN

## 2023-03-19 MED ORDER — LORAZEPAM 2 MG/ML IJ SOLN: 1.0000 mg | INTRAMUSCULAR | 0 refills | Status: DC | PRN

## 2023-03-19 MED ORDER — LORAZEPAM 2 MG/ML PO CONC: 1.0000 mg | ORAL | 0 refills | Status: AC | PRN

## 2023-03-19 MED ORDER — GLYCOPYRROLATE 1 MG PO TABS: 1.0000 mg | ORAL_TABLET | ORAL | 0 refills | Status: AC | PRN

## 2023-03-19 MED ORDER — MORPHINE SULFATE (PF) 2 MG/ML IV SOLN: 1.0000 mg | INTRAVENOUS | 0 refills | Status: AC | PRN

## 2023-03-19 MED ORDER — LORAZEPAM 1 MG PO TABS: 1.0000 mg | ORAL_TABLET | ORAL | 0 refills | Status: AC | PRN

## 2023-03-19 MED ORDER — HALOPERIDOL LACTATE 5 MG/ML IJ SOLN: 0.5000 mg | INTRAMUSCULAR | 0 refills | Status: AC | PRN

## 2023-03-19 MED ORDER — LORAZEPAM 2 MG/ML IJ SOLN: 1.0000 mg | INTRAMUSCULAR | 0 refills | Status: AC | PRN

## 2023-03-19 MED ORDER — LORAZEPAM 1 MG PO TABS: 1.0000 mg | ORAL_TABLET | ORAL | 0 refills | Status: DC | PRN

## 2023-03-19 NOTE — ED Notes (Addendum)
Transfer of care report given to [facility] team.

## 2023-03-19 NOTE — Discharge Summary (Signed)
Name: Tina Shah MRN: [id number] DOB: 10-20-1940 82 y.o. PCP: Hadley Pen, MD  Date of Admission: 03/18/2023  7:10 AM Date of Discharge:  03/19/2023 Attending Physician: Dr.  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 [facility])   Hospice care patient   Palliative care patient    DISCHARGE MEDICATIONS:   Allergies as of 03/19/2023       Reactions   Cetirizine Other (See Comments)   Flares Parkinson's        Medication List     STOP taking these medications    acetaminophen 325 MG tablet Commonly known as: TYLENOL   Calmoseptine 0.44-20.6 % Oint Generic drug: Menthol-Zinc Oxide   carbidopa-levodopa 50-200 MG tablet Commonly known as: SINEMET CR   chlorhexidine 0.12 % solution Commonly known as: PERIDEX   Cholecalciferol 125 MCG (5000 UT) capsule   fluticasone 50 MCG/ACT nasal spray Commonly known as: FLONASE   ibuprofen 600 MG tablet Commonly known as: ADVIL   melatonin 3 MG Tabs tablet   oxycodone 5 MG capsule Commonly known as: OXY-IR   Oyster Shell 500 MG Tabs   pramipexole 0.5 MG tablet Commonly known as: MIRAPEX   pravastatin 10 MG tablet Commonly known as: PRAVACHOL   senna 8.6 MG Tabs tablet Commonly known as: SENOKOT   sertraline 50 MG tablet Commonly known as: ZOLOFT       TAKE these medications    glycopyrrolate 1 MG tablet Commonly known as: ROBINUL Take 1 tablet (1 mg total) by mouth every 4 (four) hours as needed (excessive secretions).   glycopyrrolate 0.2 MG/ML injection Commonly known as: ROBINUL Inject 1 mL (0.2 mg total) into the skin every 4 (four) hours as needed (excessive secretions).   glycopyrrolate 0.2 MG/ML injection Commonly known as: ROBINUL Inject 1 mL (0.2 mg total) into the vein every 4 (four) hours as needed  (excessive secretions).   haloperidol 0.5 MG tablet Commonly known as: HALDOL Take 1 tablet (0.5 mg total) by mouth every 4 (four) hours as needed for agitation (or delirium).   haloperidol 2 MG/ML solution Commonly known as: HALDOL Place 0.3 mLs (0.6 mg total) under the tongue every 4 (four) hours as needed for agitation (or delirium).   haloperidol lactate 5 MG/ML injection Commonly known as: HALDOL Inject 0.1 mLs (0.5 mg total) into the vein every 4 (four) hours as needed (or delirium).   LORazepam 1 MG tablet Commonly known as: ATIVAN Take 1 tablet (1 mg total) by mouth every 4 (four) hours as needed for anxiety.   LORazepam 2 MG/ML concentrated solution Commonly known as: ATIVAN Place 0.5 mLs (1 mg total) under the tongue every 4 (four) hours as needed for anxiety.   LORazepam 2 MG/ML injection Commonly known as: ATIVAN Inject 0.5 mLs (1 mg total) into the vein every 4 (four) hours as needed for anxiety.   morphine (PF) 2 MG/ML injection Inject 0.5 mLs (1 mg total) into the vein every 2 (two) hours as needed (or dyspnea).        DISPOSITION AND FOLLOW-UP:  Tina Shah was discharged from [facility] in Critical condition.  Follow-up Recommendations: Hospice, Comfort Care  HOSPITAL COURSE:  Patient Summary: Tina Shah is an 82yo woman with Parkinson Disease with dementia and recurrent UTI's, getting Hospice at [facility], who presented from her  Memory Care unit after being found unresponsive with dark emesis around her. Workup revealed a hemorrhagic contusion in left temporal lobe and small acute SDH. She was persistently unresponsive during her stay here. She was admitted for observation of improvement after neurosurgery did not recommend surgery. She continued to slowly worsen and become less responsive. Goals of care were discussed with the patient's family who chose to proceed with comfort care.    DISCHARGE INSTRUCTIONS:    SUBJECTIVE:   Patient moves limbs spontaneous, but otherwise unresponsive. Family at bedside. We dicussed the patient's status and decision was made to peruse comfort care.   Discharge Vitals:   BP (!) 108/58   Pulse 82   Temp 98.4 F (36.9 C) (Axillary)   Resp 15   SpO2 99%   OBJECTIVE:  Physical Exam Constitutional:      Appearance: She is ill-appearing.  HENT:     Head: Normocephalic and atraumatic.  Cardiovascular:     Rate and Rhythm: Normal rate and regular rhythm.  Pulmonary:     Effort: Pulmonary effort is normal.  Abdominal:     General: Abdomen is flat.     Palpations: Abdomen is soft.  Skin:    General: Skin is warm and dry.  Neurological:     Comments: GCS - 8 E(1) V(2) M(5)      Pertinent Labs, Studies, and Procedures:     Latest Ref Rng & Units 03/19/2023    6:13 AM 03/18/2023    8:44 AM 03/18/2023    7:24 AM  CBC  WBC 4.0 - 10.5 K/uL 7.8   9.4   Hemoglobin 12.0 - 15.0 g/dL 9.4  16.1  09.6   Hematocrit 36.0 - 46.0 % 30.7  37.0  35.9   Platelets 150 - 400 K/uL 270   322        Latest Ref Rng & Units 03/19/2023    6:13 AM 03/18/2023    8:44 AM 03/18/2023    7:24 AM  CMP  Glucose 70 - 99 mg/dL 045  409  811   BUN 8 - 23 mg/dL 12  12  12    Creatinine 0.44 - 1.00 mg/dL 9.14  7.82  9.56   Sodium 135 - 145 mmol/L 138  139  137   Potassium 3.5 - 5.1 mmol/L 3.2  3.6  3.6   Chloride 98 - 111 mmol/L 102  101  102   CO2 22 - 32 mmol/L 22   24   Calcium 8.9 - 10.3 mg/dL 8.4   9.0   Total Protein 6.5 - 8.1 g/dL   6.8   Total Bilirubin 0.3 - 1.2 mg/dL   0.4   Alkaline Phos 38 - 126 U/L   117   AST 15 - 41 U/L   18   ALT 0 - 44 U/L   7     MR BRAIN WO CONTRAST  Result Date: 03/18/2023 CLINICAL DATA:  Seizures, hemorrhagic contusion on CT EXAM: MRI HEAD WITHOUT CONTRAST TECHNIQUE: Multiplanar, multiecho pulse sequences of the brain and surrounding structures were obtained without intravenous contrast. COMPARISON:  Same-day head CT FINDINGS: Brain: There is 3.9 cm focus of  FLAIR hyperintensity and swelling in the left temporal lobe. There is associated SWI signal dropout and peripheral DWI signal abnormality. This remains most in keeping with a hemorrhagic contusion as seen on same-day CT head. There is a small left cerebral convexity subdural hematoma measuring up to 3 mm in thickness over the occipital lobe. A  small amount of blood extends over the left tentorial leaflet. There is also trace subdural blood overlying the right frontal lobe (6-22). There is no significant mass effect on the underlying brain parenchyma and no midline shift. A punctate chronic microhemorrhage in the brainstem is unchanged since 2011. There is no other evidence of intracranial hemorrhage. There is no acute infarct. The hippocampi are normal in signal and architecture. Background parenchymal volume is normal. The ventricles are normal in size. There is no intraventricular hemorrhage. The pituitary and suprasellar region are normal there is no solid mass lesion. Vascular: Normal flow voids. Skull and upper cervical spine: Normal marrow signal. Sinuses/Orbits: There is trace fluid in the right maxillary sinus. Bilateral lens implants are in place. The globes and orbits are otherwise unremarkable. Other: There is partial opacification of the left mastoid air cells as seen on same-day CT. IMPRESSION: Findings again most in keeping with a hemorrhagic contusion in the left temporal lobe, small acute subdural hematoma overlying the left cerebral convexity, and trace subdural blood overlying the right frontal lobe. No significant mass effect or midline shift. Electronically Signed   By: Lesia Hausen M.D.   On: 03/18/2023 16:05   CT ABDOMEN PELVIS W CONTRAST  Result Date: 03/18/2023 CLINICAL DATA:  Seizure and abdominal pain EXAM: CT ABDOMEN AND PELVIS WITH CONTRAST TECHNIQUE: Multidetector CT imaging of the abdomen and pelvis was performed using the standard protocol following bolus administration of  intravenous contrast. RADIATION DOSE REDUCTION: This exam was performed according to the departmental dose-optimization program which includes automated exposure control, adjustment of the mA and/or kV according to patient size and/or use of iterative reconstruction technique. CONTRAST:  75mL OMNIPAQUE IOHEXOL 350 MG/ML SOLN COMPARISON:  CT abdomen and pelvis dated 10/30/2022, 12/15/2011 FINDINGS: Lower chest: No focal consolidation or pulmonary nodule in the lung bases. No pleural effusion or pneumothorax demonstrated. Partially imaged heart size is normal. Coronary artery calcifications. Hepatobiliary: Unchanged 1.7 cm cyst near the caudate lobe (3:27) dating back to 2013. Additional scattered subcentimeter hypodensities in segment 2 and 4, too small to characterize but also unchanged. No intra or extrahepatic biliary ductal dilation. Normal gallbladder. Pancreas: No focal lesions or main ductal dilation. Spleen: Normal in size without focal abnormality. Adrenals/Urinary Tract: Unchanged 2.5 cm left adrenal nodule measures 56 HU, likely adenoma. No specific follow-up imaging recommended. No right adrenal nodule. No suspicious renal mass, calculi or hydronephrosis. Bilateral simple-appearing parapelvic cysts. No specific follow-up imaging recommended. No focal bladder wall thickening. Stomach/Bowel: Small to moderate hiatal hernia. Normal appearance of the stomach. Few loops of dilated small bowel, for example left lower quadrant (6:54) without abrupt caliber transition. No abnormal mural thickening. Appendix is not discretely seen. Vascular/Lymphatic: Aortic atherosclerosis. No enlarged abdominal or pelvic lymph nodes. Reproductive: No adnexal masses. Other: No free fluid, fluid collection, or free air. Musculoskeletal: No acute or abnormal lytic or blastic osseous lesions. Multilevel degenerative changes of the partially imaged thoracic and lumbar spine. Unchanged T10 and L3 compression deformities. Multiple old  right rib fractures. Partially imaged bilateral proximal femoral fixation. Hardware appears intact. Old left proximal femoral fracture. IMPRESSION: 1. Dilated rectum contains large volume stool. 2. Few dilated loops of small bowel, likely ileus. 3. Small to moderate hiatal hernia. 4. Aortic Atherosclerosis (ICD10-I70.0). Coronary artery calcifications. Assessment for potential risk factor modification, dietary therapy or pharmacologic therapy may be warranted, if clinically indicated. Electronically Signed   By: Agustin Cree M.D.   On: 03/18/2023 10:20   CT Head Wo Contrast  Result Date: 03/18/2023 CLINICAL DATA:  Seizures, abdominal pain, altered mental status EXAM: CT HEAD WITHOUT CONTRAST TECHNIQUE: Contiguous axial images were obtained from the base of the skull through the vertex without intravenous contrast. RADIATION DOSE REDUCTION: This exam was performed according to the departmental dose-optimization program which includes automated exposure control, adjustment of the mA and/or kV according to patient size and/or use of iterative reconstruction technique. COMPARISON:  CT head 12/09/2022 FINDINGS: Brain: There is hypodensity with loss of gray-white differentiation in the left temporal lobe. There is intraparenchymal hemorrhage measuring 1.2 cm x 0.4 cm as well as probable subdural blood along the undersurface of the temporal lobe and left tentorial leaflet (5-53). There is regional sulcal effacement but no significant mass effect and no midline shift. There is pneumocephalus overlying the left cerebral hemisphere of uncertain origin. There is no other evidence of acute intracranial hemorrhage or acute territorial infarct. Background parenchymal volume is normal. Gray-white differentiation is otherwise preserved. The ventricles are normal in size. The pituitary and suprasellar region are normal. There is no mass lesion. There is no mass effect or midline shift. Vascular: No hyperdense vessel or unexpected  calcification. Skull: No definite calvarial fracture is seen. Sinuses/Orbits: There is mild mucosal thickening in the paranasal sinuses. Bilateral lens implants are in place. The globes and orbits are otherwise unremarkable. Other: There is opacification of the left middle ear cavity. IMPRESSION: 1. Hypodensity in the left temporal lobe with a small focus of intraparenchymal hemorrhage consistent with a hemorrhagic contusion, as well as small volume subdural blood over the left tentorial leaflet and overlying the temporal lobe. No mass effect or midline shift. 2. Pneumocephalus overlying the left cerebral hemisphere of uncertain etiology but possibly reflecting an occult temporal bone fracture given opacification of the middle ear cavity. Consider dedicated temporal bone CT for further evaluation. Critical Value/emergent results were called by telephone at the time of interpretation on 03/18/2023 at 9:59 am to provider [facility] , who verbally acknowledged these results. Electronically Signed   By: Lesia Hausen M.D.   On: 03/18/2023 10:01   DG Chest Port 1 View  Result Date: 03/18/2023 CLINICAL DATA:  altered ms EXAM: PORTABLE CHEST 1 VIEW COMPARISON:  CXR 05/22/22 FINDINGS: Assessment is limited due to patient positioning. Within this limitation- No pleural effusion. No pneumothorax. Likely normal cardiac and mediastinal contours. Redemonstrated multiple left-sided chronic rib fractures. There is a subdiaphragmatic lucency on the left that could represent superimposition of gastric and colonic gaseous distention, but if there is clinical concern for pneumoperitoneum, further evaluation with dedicated abdominal radiograph is recommended. IMPRESSION: Limited exam due to patient positioning 1. No focal airspace opacity 2. There is a subdiaphragmatic lucency on the left that could represent superimposition of gas distended loops of bowel and/or stomach, but if there is clinical concern for pneumoperitoneum,  further evaluation with dedicated abdominal radiograph is recommended. Electronically Signed   By: Lorenza Cambridge M.D.   On: 03/18/2023 08:57     Signed: Lovie Macadamia MD Internal Medicine Resident, PGY-1 [facility]  Pager: [phone] 1:24 PM, 03/19/2023

## 2023-03-19 NOTE — Progress Notes (Signed)
CSW spoke with Tina Shah [phone] at the [facility]. CSW faxed over a residential hospice referral to [phone]. Patient is currently under review.

## 2023-03-19 NOTE — ED Notes (Signed)
Patient incontinent of urine.  Cleaned and new adult brief replaced.  Patient repositioned in bed and rolled on to L side.  Patient remains nonverbal and does not open eyes.  Will move extremities but not to command.

## 2023-03-19 NOTE — Hospital Course (Addendum)
Tina Shah is an 82yo woman with Parkinson Disease with dementia and recurrent UTI's, getting Hospice at [facility], who presented from her Memory Care unit after being found unresponsive with dark emesis around her. Workup revealed a hemorrhagic contusion in left temporal lobe and small acute SDH. She was persistently unresponsive during her stay here. She was admitted for observation of improvement after neurosurgery did not recommend surgery. She continued to slowly worsen and become less responsive. Goals of care were discussed with the patient's family who chose to proceed with comfort care.

## 2023-03-31 DEATH — deceased
